# Patient Record
Sex: Female | Born: 1955 | Race: Black or African American | Hispanic: No | Marital: Married | Smoking: Never smoker
Health system: Southern US, Community
[De-identification: ages and names within clinical notes are randomized; demographics above are authoritative.]

## PROBLEM LIST (undated history)

## (undated) DIAGNOSIS — IMO0001 Reserved for inherently not codable concepts without codable children: Secondary | ICD-10-CM

## (undated) DIAGNOSIS — Z9889 Other specified postprocedural states: Secondary | ICD-10-CM

## (undated) DIAGNOSIS — D219 Benign neoplasm of connective and other soft tissue, unspecified: Secondary | ICD-10-CM

## (undated) DIAGNOSIS — Z923 Personal history of irradiation: Secondary | ICD-10-CM

## (undated) DIAGNOSIS — IMO0002 Reserved for concepts with insufficient information to code with codable children: Secondary | ICD-10-CM

## (undated) DIAGNOSIS — R011 Cardiac murmur, unspecified: Secondary | ICD-10-CM

## (undated) DIAGNOSIS — C50919 Malignant neoplasm of unspecified site of unspecified female breast: Secondary | ICD-10-CM

## (undated) DIAGNOSIS — J302 Other seasonal allergic rhinitis: Secondary | ICD-10-CM

## (undated) HISTORY — PX: COLONOSCOPY: SHX174

## (undated) HISTORY — DX: Benign neoplasm of connective and other soft tissue, unspecified: D21.9

## (undated) HISTORY — DX: Malignant neoplasm of unspecified site of unspecified female breast: C50.919

## (undated) HISTORY — DX: Reserved for concepts with insufficient information to code with codable children: IMO0002

## (undated) HISTORY — DX: Reserved for inherently not codable concepts without codable children: IMO0001

---

## 1995-02-08 HISTORY — PX: MYOMECTOMY: SHX85

## 1998-01-14 ENCOUNTER — Other Ambulatory Visit: Admission: RE | Admit: 1998-01-14 | Discharge: 1998-01-14 | Payer: Self-pay | Admitting: Obstetrics and Gynecology

## 1998-02-12 ENCOUNTER — Ambulatory Visit (HOSPITAL_COMMUNITY): Admission: RE | Admit: 1998-02-12 | Discharge: 1998-02-12 | Payer: Self-pay | Admitting: Obstetrics and Gynecology

## 1998-03-13 ENCOUNTER — Encounter: Payer: Self-pay | Admitting: Internal Medicine

## 1998-03-13 ENCOUNTER — Ambulatory Visit (HOSPITAL_COMMUNITY): Admission: RE | Admit: 1998-03-13 | Discharge: 1998-03-13 | Payer: Self-pay | Admitting: Internal Medicine

## 1999-02-09 ENCOUNTER — Other Ambulatory Visit: Admission: RE | Admit: 1999-02-09 | Discharge: 1999-02-09 | Payer: Self-pay | Admitting: Obstetrics and Gynecology

## 1999-02-19 ENCOUNTER — Ambulatory Visit (HOSPITAL_COMMUNITY): Admission: RE | Admit: 1999-02-19 | Discharge: 1999-02-19 | Payer: Self-pay | Admitting: Obstetrics and Gynecology

## 1999-02-19 ENCOUNTER — Encounter: Payer: Self-pay | Admitting: Obstetrics and Gynecology

## 1999-06-04 ENCOUNTER — Encounter: Payer: Self-pay | Admitting: Obstetrics and Gynecology

## 1999-06-04 ENCOUNTER — Ambulatory Visit (HOSPITAL_COMMUNITY): Admission: RE | Admit: 1999-06-04 | Discharge: 1999-06-04 | Payer: Self-pay | Admitting: Obstetrics and Gynecology

## 2000-03-03 ENCOUNTER — Ambulatory Visit (HOSPITAL_COMMUNITY): Admission: RE | Admit: 2000-03-03 | Discharge: 2000-03-03 | Payer: Self-pay | Admitting: Obstetrics and Gynecology

## 2000-03-03 ENCOUNTER — Encounter: Payer: Self-pay | Admitting: Obstetrics and Gynecology

## 2000-05-23 ENCOUNTER — Other Ambulatory Visit: Admission: RE | Admit: 2000-05-23 | Discharge: 2000-05-23 | Payer: Self-pay | Admitting: Obstetrics and Gynecology

## 2001-03-07 ENCOUNTER — Encounter: Payer: Self-pay | Admitting: Obstetrics and Gynecology

## 2001-03-07 ENCOUNTER — Ambulatory Visit (HOSPITAL_COMMUNITY): Admission: RE | Admit: 2001-03-07 | Discharge: 2001-03-07 | Payer: Self-pay | Admitting: Obstetrics and Gynecology

## 2001-05-29 ENCOUNTER — Other Ambulatory Visit: Admission: RE | Admit: 2001-05-29 | Discharge: 2001-05-29 | Payer: Self-pay | Admitting: Obstetrics and Gynecology

## 2002-06-04 ENCOUNTER — Encounter: Payer: Self-pay | Admitting: Obstetrics and Gynecology

## 2002-06-04 ENCOUNTER — Ambulatory Visit (HOSPITAL_COMMUNITY): Admission: RE | Admit: 2002-06-04 | Discharge: 2002-06-04 | Payer: Self-pay | Admitting: Family Medicine

## 2002-06-26 ENCOUNTER — Other Ambulatory Visit: Admission: RE | Admit: 2002-06-26 | Discharge: 2002-06-26 | Payer: Self-pay | Admitting: Obstetrics and Gynecology

## 2003-06-24 ENCOUNTER — Ambulatory Visit (HOSPITAL_COMMUNITY): Admission: RE | Admit: 2003-06-24 | Discharge: 2003-06-24 | Payer: Self-pay | Admitting: Obstetrics and Gynecology

## 2003-06-27 ENCOUNTER — Other Ambulatory Visit: Admission: RE | Admit: 2003-06-27 | Discharge: 2003-06-27 | Payer: Self-pay | Admitting: Obstetrics and Gynecology

## 2004-06-30 ENCOUNTER — Ambulatory Visit (HOSPITAL_COMMUNITY): Admission: RE | Admit: 2004-06-30 | Discharge: 2004-06-30 | Payer: Self-pay | Admitting: Obstetrics and Gynecology

## 2004-07-08 ENCOUNTER — Encounter: Admission: RE | Admit: 2004-07-08 | Discharge: 2004-07-08 | Payer: Self-pay | Admitting: Obstetrics and Gynecology

## 2004-07-14 ENCOUNTER — Other Ambulatory Visit: Admission: RE | Admit: 2004-07-14 | Discharge: 2004-07-14 | Payer: Self-pay | Admitting: Addiction Medicine

## 2005-01-12 ENCOUNTER — Encounter: Admission: RE | Admit: 2005-01-12 | Discharge: 2005-01-12 | Payer: Self-pay | Admitting: Obstetrics and Gynecology

## 2005-07-06 ENCOUNTER — Encounter: Admission: RE | Admit: 2005-07-06 | Discharge: 2005-07-06 | Payer: Self-pay | Admitting: Obstetrics and Gynecology

## 2005-08-15 ENCOUNTER — Other Ambulatory Visit: Admission: RE | Admit: 2005-08-15 | Discharge: 2005-08-15 | Payer: Self-pay | Admitting: Obstetrics and Gynecology

## 2006-08-25 ENCOUNTER — Encounter: Admission: RE | Admit: 2006-08-25 | Discharge: 2006-08-25 | Payer: Self-pay | Admitting: Obstetrics and Gynecology

## 2006-09-14 ENCOUNTER — Other Ambulatory Visit: Admission: RE | Admit: 2006-09-14 | Discharge: 2006-09-14 | Payer: Self-pay | Admitting: Obstetrics and Gynecology

## 2007-03-17 ENCOUNTER — Ambulatory Visit (HOSPITAL_COMMUNITY): Admission: RE | Admit: 2007-03-17 | Discharge: 2007-03-17 | Payer: Self-pay | Admitting: Ophthalmology

## 2007-09-14 ENCOUNTER — Encounter: Admission: RE | Admit: 2007-09-14 | Discharge: 2007-09-14 | Payer: Self-pay | Admitting: Obstetrics and Gynecology

## 2007-09-21 ENCOUNTER — Other Ambulatory Visit: Admission: RE | Admit: 2007-09-21 | Discharge: 2007-09-21 | Payer: Self-pay | Admitting: Obstetrics and Gynecology

## 2008-09-16 ENCOUNTER — Encounter: Admission: RE | Admit: 2008-09-16 | Discharge: 2008-09-16 | Payer: Self-pay | Admitting: Obstetrics and Gynecology

## 2008-09-19 ENCOUNTER — Encounter: Admission: RE | Admit: 2008-09-19 | Discharge: 2008-09-19 | Payer: Self-pay | Admitting: Obstetrics and Gynecology

## 2008-10-07 ENCOUNTER — Ambulatory Visit: Payer: Self-pay | Admitting: Obstetrics and Gynecology

## 2008-10-07 ENCOUNTER — Encounter: Payer: Self-pay | Admitting: Obstetrics and Gynecology

## 2008-10-07 ENCOUNTER — Other Ambulatory Visit: Admission: RE | Admit: 2008-10-07 | Discharge: 2008-10-07 | Payer: Self-pay | Admitting: Obstetrics and Gynecology

## 2009-10-05 ENCOUNTER — Encounter: Admission: RE | Admit: 2009-10-05 | Discharge: 2009-10-05 | Payer: Self-pay | Admitting: Obstetrics and Gynecology

## 2009-10-26 ENCOUNTER — Other Ambulatory Visit: Admission: RE | Admit: 2009-10-26 | Discharge: 2009-10-26 | Payer: Self-pay | Admitting: Obstetrics and Gynecology

## 2009-10-26 ENCOUNTER — Ambulatory Visit: Payer: Self-pay | Admitting: Obstetrics and Gynecology

## 2010-02-02 ENCOUNTER — Ambulatory Visit: Payer: Self-pay | Admitting: Obstetrics and Gynecology

## 2010-02-28 ENCOUNTER — Encounter: Payer: Self-pay | Admitting: Obstetrics and Gynecology

## 2010-09-28 ENCOUNTER — Other Ambulatory Visit: Payer: Self-pay | Admitting: Obstetrics and Gynecology

## 2010-09-28 DIAGNOSIS — Z1231 Encounter for screening mammogram for malignant neoplasm of breast: Secondary | ICD-10-CM

## 2010-10-07 ENCOUNTER — Ambulatory Visit
Admission: RE | Admit: 2010-10-07 | Discharge: 2010-10-07 | Disposition: A | Discharge status: Home / Self Care | Payer: Managed Care, Other (non HMO) | Source: Ambulatory Visit | Attending: Obstetrics and Gynecology | Admitting: Obstetrics and Gynecology

## 2010-10-07 DIAGNOSIS — Z1231 Encounter for screening mammogram for malignant neoplasm of breast: Secondary | ICD-10-CM

## 2010-11-15 ENCOUNTER — Encounter: Payer: Self-pay | Admitting: Gynecology

## 2010-11-15 DIAGNOSIS — D219 Benign neoplasm of connective and other soft tissue, unspecified: Secondary | ICD-10-CM | POA: Insufficient documentation

## 2010-11-16 ENCOUNTER — Ambulatory Visit (INDEPENDENT_AMBULATORY_CARE_PROVIDER_SITE_OTHER): Payer: Managed Care, Other (non HMO) | Admitting: Obstetrics and Gynecology

## 2010-11-16 ENCOUNTER — Encounter: Payer: Self-pay | Admitting: Obstetrics and Gynecology

## 2010-11-16 ENCOUNTER — Other Ambulatory Visit (HOSPITAL_COMMUNITY)
Admission: RE | Admit: 2010-11-16 | Discharge: 2010-11-16 | Disposition: A | Discharge status: Home / Self Care | Payer: Managed Care, Other (non HMO) | Source: Ambulatory Visit | Attending: Obstetrics and Gynecology | Admitting: Obstetrics and Gynecology

## 2010-11-16 VITALS — BP 150/90 | Ht 67.0 in | Wt 182.0 lb

## 2010-11-16 DIAGNOSIS — Z13220 Encounter for screening for lipoid disorders: Secondary | ICD-10-CM

## 2010-11-16 DIAGNOSIS — Z01419 Encounter for gynecological examination (general) (routine) without abnormal findings: Secondary | ICD-10-CM | POA: Insufficient documentation

## 2010-11-16 NOTE — Progress Notes (Signed)
Patient came to see me today for her annual GYN exam. She is up-to-date on mammograms and bone densities. She is doing well without HRT . Her blood pressure was slightly elevated today at 150/90. She is now working at home  And has gained 13 pounds as she snacks a lot.  Physical examination: HEENT within normal limits. Neck: Thyroid not large. No masses. Supraclavicular nodes: not enlarged. Breasts: Examined in both sitting midline position. No skin changes and no masses. Abdomen: Soft no guarding rebound or masses or hernia. Pelvic: External: Within normal limits. BUS: Within normal limits. Vaginal:within normal limits. Good estrogen effect. No evidence of cystocele rectocele or enterocele. Cervix: clean. Uterus: Normal size and shape. Adnexa: No masses. Rectovaginal exam: Confirmatory and negative. Extremities: Within normal limits.  Assessment: Possible hypertension  Plan: Patient to monitor her blood pressure at home and report. Patient work on weight loss. We'll discuss after we review the other blood pressures whether she needs PCP referral.

## 2010-11-16 NOTE — Patient Instructions (Signed)
Take blood pressure 3 or 4 times over the next week and call us with the results.

## 2010-11-23 ENCOUNTER — Telehealth: Payer: Self-pay | Admitting: *Deleted

## 2010-11-23 NOTE — Telephone Encounter (Signed)
These blood pressures are fine. No treatment needed. Continue with diet.

## 2010-11-23 NOTE — Telephone Encounter (Signed)
Patient informed. 

## 2010-11-23 NOTE — Telephone Encounter (Signed)
Patient was calling to report blood pressure readings: 11/19/10 : 135/74 (am)  135/72 (midday)  127/71 (pm) 11/20/10:  131/78 (am)  134/79 (midday)  120/76 (pm) 11/21/10:  128/81 (am)                              119/75 (pm) 11/22/10:  125/75 (am)                              134/82 (pm) 11/23/10:  129/78 (am)  124/77 (2pm) Patient said she has increased her water and fruit.  Stopped snacks and watching salt as well.  The first day she was at work, and the second day she was at home but active cleaning.

## 2010-12-13 ENCOUNTER — Encounter: Payer: Managed Care, Other (non HMO) | Admitting: Obstetrics and Gynecology

## 2011-10-24 ENCOUNTER — Other Ambulatory Visit: Payer: Self-pay | Admitting: Obstetrics and Gynecology

## 2011-11-01 ENCOUNTER — Ambulatory Visit (INDEPENDENT_AMBULATORY_CARE_PROVIDER_SITE_OTHER): Payer: Managed Care, Other (non HMO) | Admitting: Obstetrics and Gynecology

## 2011-11-01 ENCOUNTER — Telehealth: Payer: Self-pay | Admitting: *Deleted

## 2011-11-01 DIAGNOSIS — N6019 Diffuse cystic mastopathy of unspecified breast: Secondary | ICD-10-CM

## 2011-11-01 DIAGNOSIS — N644 Mastodynia: Secondary | ICD-10-CM

## 2011-11-01 NOTE — Progress Notes (Signed)
Patient came to see me today with a 3-4 week history of pain in her left upper breast. She seems to notice it more after working out at gym. She has reduced caffeine and the pain is getting better. She is due for her yearly mammogram. She does not feel any lumps. She has a history of fibrocystic breast disease. She does not take estrogen.  Exam: Kennon Portela present. Both breasts were carefully examined in the sitting and lying positions. There are no skin changes. There are no dominant lesions. She is lumpier in her left upper outer breast.  Assessment: #1. Mastodynia #2. Fibrocystic breast disease  Plan: Continue low caffeine diet. Vitamin E 400 mg daily. Diagnostic mammogram of left breast with screening mammogram of right breast.

## 2011-11-01 NOTE — Telephone Encounter (Signed)
Message copied by Aura Camps on Tue Nov 01, 2011  3:54 PM ------      Message from: Trellis Paganini      Created: Tue Nov 01, 2011 12:04 PM       Patient is having pain in her left upper breast. Schedule her for a diagnostic mammogram of left breast and screening mammogram of  right breast at [facility] on [facility].

## 2011-11-01 NOTE — Patient Instructions (Signed)
We have scheduled you for diagnostic mammogram and will let you know when.

## 2011-11-01 NOTE — Telephone Encounter (Signed)
Order placed for bil. Diag. Mammo.

## 2011-11-02 NOTE — Telephone Encounter (Signed)
appointment 11/07/11 @ 7:20 am

## 2011-11-07 ENCOUNTER — Ambulatory Visit
Admission: RE | Admit: 2011-11-07 | Discharge: 2011-11-07 | Disposition: A | Discharge status: Home / Self Care | Payer: Managed Care, Other (non HMO) | Source: Ambulatory Visit | Attending: Obstetrics and Gynecology | Admitting: Obstetrics and Gynecology

## 2011-11-07 ENCOUNTER — Other Ambulatory Visit: Payer: Self-pay | Admitting: Obstetrics and Gynecology

## 2011-11-07 DIAGNOSIS — N644 Mastodynia: Secondary | ICD-10-CM

## 2011-11-07 DIAGNOSIS — C50919 Malignant neoplasm of unspecified site of unspecified female breast: Secondary | ICD-10-CM | POA: Insufficient documentation

## 2011-11-07 HISTORY — DX: Malignant neoplasm of unspecified site of unspecified female breast: C50.919

## 2011-11-08 ENCOUNTER — Telehealth: Payer: Self-pay | Admitting: Obstetrics and Gynecology

## 2011-11-08 ENCOUNTER — Other Ambulatory Visit: Payer: Self-pay | Admitting: Obstetrics and Gynecology

## 2011-11-08 DIAGNOSIS — C50912 Malignant neoplasm of unspecified site of left female breast: Secondary | ICD-10-CM

## 2011-11-08 NOTE — Telephone Encounter (Signed)
Olegario Messier called me to request I get patient's MRI of Breast for Diagnosis of Breast Cancer precertified.  I called [employer] who require I call [employer] at [phone].  I spoke with Psyche and she gave me approval #[id number] good for 10/1-12/30/13.  I called and provided this approval # to Olegario Messier at the [facility].  (MRI is scheduled Sunday Oct 6 12:30pm at [facility].)

## 2011-11-10 ENCOUNTER — Telehealth: Payer: Self-pay | Admitting: *Deleted

## 2011-11-10 DIAGNOSIS — C50412 Malignant neoplasm of upper-outer quadrant of left female breast: Secondary | ICD-10-CM | POA: Insufficient documentation

## 2011-11-10 DIAGNOSIS — C50419 Malignant neoplasm of upper-outer quadrant of unspecified female breast: Secondary | ICD-10-CM

## 2011-11-10 NOTE — Telephone Encounter (Signed)
Confirmed [facility] for 11/16/11 at 1230 .  Instructions and contact information given.

## 2011-11-13 ENCOUNTER — Ambulatory Visit
Admission: RE | Admit: 2011-11-13 | Discharge: 2011-11-13 | Disposition: A | Discharge status: Home / Self Care | Payer: Managed Care, Other (non HMO) | Source: Ambulatory Visit | Attending: Obstetrics and Gynecology | Admitting: Obstetrics and Gynecology

## 2011-11-13 DIAGNOSIS — C50912 Malignant neoplasm of unspecified site of left female breast: Secondary | ICD-10-CM

## 2011-11-13 MED ORDER — GADOBENATE DIMEGLUMINE 529 MG/ML IV SOLN
17.0000 mL | Freq: Once | INTRAVENOUS | Status: AC | PRN
  Administered 2011-11-13: 17 mL via INTRAVENOUS

## 2011-11-16 ENCOUNTER — Ambulatory Visit (HOSPITAL_BASED_OUTPATIENT_CLINIC_OR_DEPARTMENT_OTHER): Payer: Managed Care, Other (non HMO) | Admitting: Surgery

## 2011-11-16 ENCOUNTER — Telehealth: Payer: Self-pay | Admitting: Oncology

## 2011-11-16 ENCOUNTER — Ambulatory Visit: Payer: Managed Care, Other (non HMO) | Attending: Surgery | Admitting: Physical Therapy

## 2011-11-16 ENCOUNTER — Encounter: Payer: Self-pay | Admitting: Oncology

## 2011-11-16 ENCOUNTER — Encounter: Payer: Self-pay | Admitting: *Deleted

## 2011-11-16 ENCOUNTER — Ambulatory Visit
Admission: RE | Admit: 2011-11-16 | Discharge: 2011-11-16 | Disposition: A | Discharge status: Home / Self Care | Payer: Managed Care, Other (non HMO) | Source: Ambulatory Visit | Attending: Radiation Oncology | Admitting: Radiation Oncology

## 2011-11-16 ENCOUNTER — Ambulatory Visit (HOSPITAL_BASED_OUTPATIENT_CLINIC_OR_DEPARTMENT_OTHER): Payer: Managed Care, Other (non HMO)

## 2011-11-16 ENCOUNTER — Encounter (INDEPENDENT_AMBULATORY_CARE_PROVIDER_SITE_OTHER): Payer: Self-pay | Admitting: Surgery

## 2011-11-16 ENCOUNTER — Other Ambulatory Visit (HOSPITAL_BASED_OUTPATIENT_CLINIC_OR_DEPARTMENT_OTHER): Payer: Managed Care, Other (non HMO) | Admitting: Lab

## 2011-11-16 ENCOUNTER — Ambulatory Visit (HOSPITAL_BASED_OUTPATIENT_CLINIC_OR_DEPARTMENT_OTHER): Payer: Managed Care, Other (non HMO) | Admitting: Oncology

## 2011-11-16 VITALS — BP 180/101 | HR 90 | Temp 98.5°F | Resp 20 | Ht 67.0 in | Wt 184.0 lb

## 2011-11-16 DIAGNOSIS — Z171 Estrogen receptor negative status [ER-]: Secondary | ICD-10-CM

## 2011-11-16 DIAGNOSIS — M25619 Stiffness of unspecified shoulder, not elsewhere classified: Secondary | ICD-10-CM | POA: Insufficient documentation

## 2011-11-16 DIAGNOSIS — C50919 Malignant neoplasm of unspecified site of unspecified female breast: Secondary | ICD-10-CM

## 2011-11-16 DIAGNOSIS — IMO0001 Reserved for inherently not codable concepts without codable children: Secondary | ICD-10-CM | POA: Insufficient documentation

## 2011-11-16 DIAGNOSIS — C50419 Malignant neoplasm of upper-outer quadrant of unspecified female breast: Secondary | ICD-10-CM

## 2011-11-16 DIAGNOSIS — C50912 Malignant neoplasm of unspecified site of left female breast: Secondary | ICD-10-CM

## 2011-11-16 LAB — COMPREHENSIVE METABOLIC PANEL (CC13)
ALT: 18 U/L (ref 0–55)
AST: 19 U/L (ref 5–34)
Albumin: 4.4 g/dL (ref 3.5–5.0)
Calcium: 9.9 mg/dL (ref 8.4–10.4)
Chloride: 105 mEq/L (ref 98–107)
Potassium: 4 mEq/L (ref 3.5–5.1)
Sodium: 141 mEq/L (ref 136–145)

## 2011-11-16 LAB — CBC WITH DIFFERENTIAL/PLATELET
BASO%: 0.4 % (ref 0.0–2.0)
Basophils Absolute: 0 10*3/uL (ref 0.0–0.1)
EOS%: 0.8 % (ref 0.0–7.0)
HGB: 13.6 g/dL (ref 11.6–15.9)
MCH: 27.2 pg (ref 25.1–34.0)
MCHC: 33.4 g/dL (ref 31.5–36.0)
RDW: 14.6 % — ABNORMAL HIGH (ref 11.2–14.5)
lymph#: 2.2 10*3/uL (ref 0.9–3.3)

## 2011-11-16 NOTE — Progress Notes (Signed)
Radiation Oncology         [phone]) [phone] ________________________________  Name: Katherine Coleman MRN: [id number]  Date: 11/16/2011  DOB: 01-31-1956  CC:No primary provider on file.  Shelly Rubenstein, MD     Drue Second, MD  REFERRING PHYSICIAN: Shelly Rubenstein, MD   DIAGNOSIS: There were no encounter diagnoses.   HISTORY OF PRESENT ILLNESS::Katherine Coleman is a 56 y.o. female who is seen for an initial consultation visit. The patient is seen in multidisciplinary breast clinic today. Her case was discussed this morning at breast conference as well.  The patient indicates that she experience some pain in the left breast region and she saw evaluation for this. This led to a diagnostic bilateral mammogram on 11/07/2011. No mass was palpated in the left upper outer quadrant at that time. There is a 1.8 cm irregular mass present in the left upper outer quadrant posteriorly. No abnormalities were noted on the right. The patient therefore proceeded to undergo an ultrasound which revealed a 1.4 cm hypoechoic, irregular mass at the 1:00 position. There was also a second oval mass at the 1:30 o'clock position measuring 1.2 cm. There is also an abnormal left axillary lymph node with a markedly thickened cortex. Biopsy was performed of 3 separate areas. 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 The mass at the 1:30 o'clock position revealed no evidence of malignancy. There was also no evidence of carcinoma in one of one lymph nodes. For the positive tumor, receptor studies indicated that the tumor is ER positive and PR positive as well as HER-2/neu negative. The Ki-67 staining was 62%.  The patient proceeded to undergo an MRI scan of the breasts bilaterally. There is a 4.3 cm irregular, non-masslike area of enhancement in the upper outer quadrant of the left breast. Some of this was felt to be secondary to postoperative  hematoma. The actual discrete tumor was felt to be closer to 2-2-1/2 cm.  Given these findings and recent diagnosis I been asked to see the patient today for consideration of possible radiotherapy.   PREVIOUS RADIATION THERAPY: No   PAST MEDICAL HISTORY:  has a past medical history of Fibroid and Breast cancer.     PAST SURGICAL HISTORY: Past Surgical History  Procedure Date  . Myomectomy      FAMILY HISTORY: family history includes Cancer in her paternal aunt; Hypertension in her father and mother; and Prostate cancer in her father.   SOCIAL HISTORY:  reports that she has never smoked. She does not have any smokeless tobacco history on file. She reports that she drinks alcohol. She reports that she does not use illicit drugs.   ALLERGIES: Penicillins   MEDICATIONS:  Current Outpatient Prescriptions  Medication Sig Dispense Refill  . Calcium Carbonate-Vitamin D (CALCIUM + D PO) Take by mouth.        . Cholecalciferol (VITAMIN D PO) Take 1,000 Units by mouth.        . Loratadine (CLARITIN PO) Take by mouth.        . Multiple Vitamin (MULTIVITAMIN) tablet Take 1 tablet by mouth daily.        Marland Kitchen VITAMIN E PO Take by mouth.           REVIEW OF SYSTEMS:  A 15 point review of systems is documented in the electronic medical record. This was obtained by the nursing staff. However, I reviewed this with the patient to discuss relevant findings and make  appropriate changes.  A comprehensive review of systems was negative.    PHYSICAL EXAM:  vitals were not taken for this visit.  General: Well-developed, in no acute distress HEENT: Normocephalic, atraumatic; extraocular movements intact Neck: Supple without any lymphadenopathy Cardiovascular: Regular rate and rhythm Respiratory: Clear to auscultation bilaterally Breasts: Street mass was palpated within the left breast, no axillary adenopathy on this side. No skin changes or nipple changes. Benign findings within the right breast and  no axillary adenopathy on this side GI: Soft, nontender, normal bowel sounds Extremities: No edema present Neuro: No focal deficits     LABORATORY DATA:  Lab Results  Component Value Date   WBC 5.3 11/16/2011   HGB 13.6 11/16/2011   HCT 40.7 11/16/2011   MCV 81.4 11/16/2011   PLT 261 11/16/2011   Lab Results  Component Value Date   NA 141 11/16/2011   K 4.0 11/16/2011   CL 105 11/16/2011   CO2 24 11/16/2011   Lab Results  Component Value Date   ALT 18 11/16/2011   AST 19 11/16/2011   ALKPHOS 91 11/16/2011   BILITOT 0.60 11/16/2011      RADIOGRAPHY: US Breast Left  11/07/2011  *RADIOLOGY REPORT*  Clinical Data:  The patient experienced pain in the left upper outer quadrant that resolved following the cessation of caffeine intake and avoidance of workout machines.  DIGITAL DIAGNOSTIC BILATERAL MAMMOGRAM WITH CAD AND LEFT BREAST ULTRASOUND:  Comparison:  10/07/2010, 10/05/2009, 09/16/2008  Findings:  There are scattered fibroglandular densities.  There is a 1.8 cm irregular mass in the left upper outer quadrant posteriorly.  No abnormality is noted on the right. Mammographic images were processed with CAD.  On physical exam, no mass is palpated in the left upper outer quadrant.  Ultrasound is performed, showing an irregular hypoechoic mass at 1 o'clock 10 cm from the right nipple measuring at least 0.9 x 0.9 x 1.4 cm.  There is a second oval mass at 1:30 o'clock, 7 cm from the right nipple measuring approximately 1.2 x 1.2 x 0.4 cm.  There is an abnormal left axillary lymph node with markedly thickened cortex and compression of the normal echogenic hilum.  The appearance is suspicious for invasive mammary carcinoma with possible axillary metastasis.  Biopsy is recommended.  Options of surgical excisional biopsy and ultrasound-guided core needle biopsy were discussed with the patient.  I suggested ultrasound-guided core needle biopsy and the patient agreed with this plan.  IMPRESSION: Two suspicious  masses in the left upper outer quadrant with suspicious left axillary lymph node.  RECOMMENDATION: Ultrasound-guided core needle biopsy is recommended.  This will be performed and reported separately.  BI-RADS CATEGORY 5:  Highly suggestive of malignancy - appropriate action should be taken.   Original Report Authenticated By: Daryl Eastern, M.D.    Mr Breast Bilateral W Wo Contrast  11/14/2011  *RADIOLOGY REPORT*  Clinical Data: The patient underwent an ultrasound-guided core biopsy of a mass in the 1 o'clock region of the left breast which demonstrated invasive ductal carcinoma with lymphovascular invasion (ribbon shaped clip).  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 Histologic evaluation of the left axillary lymph node demonstrated no evidence of carcinoma.  BILATERAL BREAST MRI WITH AND WITHOUT CONTRAST  Technique: Multiplanar, multisequence MR images of both breasts were obtained prior to and following the intravenous administration of 17ml of multihance.  Three dimensional images  were evaluated at the independent DynaCad workstation.  Comparison:  Mammograms dated 11/07/2011 and 10/07/2010.  Findings: There is moderate background parenchymal enhancement pattern.  In the upper outer quadrant of the left breast there is irregular, non mass-like enhancement measuring 4.3 x 2.9 x 1.8 cm.  Some of the enhancement is thought to be secondary to postoperative hematoma.   Inferior to the abnormal enhancement is a second signal void artifact from the clip in the hyalinized fibroadenoma.  There is a 1.4 cm mildly prominent left axillary lymph node.  No abnormal enhancement is seen in the right breast.  IMPRESSION: 4.3 cm of irregular, non mass-like enhancement in the upper outer quadrant of the left breast where the invasive ductal carcinoma with lymphovascular invasion was biopsied.  Some of  the enhancement is thought to be secondary to post biopsy hematoma.  Mildly prominent left axillary lymph node.  RECOMMENDATION: Treatment planning is recommended.  THREE-DIMENSIONAL MR IMAGE RENDERING ON INDEPENDENT WORKSTATION:  Three-dimensional MR images were rendered by post-processing of the original MR data on an independent workstation.  The three- dimensional MR images were interpreted, and findings were reported in the accompanying complete MRI report for this study.  BI-RADS CATEGORY 6:  Known biopsy-proven malignancy - appropriate action should be taken.   Original Report Authenticated By: Littie Deeds. Judyann Munson, M.D.    [place] Core Biopsy  11/09/2011  *RADIOLOGY REPORT*  Clinical Data:  Two masses in the left upper outer quadrant and an abnormal left axillary lymph node  ULTRASOUND GUIDED VACUUM ASSISTED CORE BIOPSY OF THE LEFT BREAST X TWO AND ULTRASOUND-GUIDED CORE NEEDLE BIOPSY OF THE LEFT AXILLA:  The patient and I discussed the procedure of ultrasound-guided biopsy, including benefits and alternatives.  We discussed the high likelihood of a successful procedure. We discussed the risks of the procedure including infection, bleeding, tissue injury, clip migration, and inadequate sampling.  Informed written consent was given.  Using sterile technique, 2% lidocaine, ultrasound guidance, and a 12 gauge vacuum assisted needle, biopsy was performed of the mass at 1 o'clock 10 cm from the left nipple using a lateromedial approach.  Using sterile technique, 2% lidocaine, ultrasound guidance, and a 12 gauge vacuum-assisted needle, biopsy was performed of the mass at 1:30 o'clock, 7 cm from the left nipple using a lateromedial approach.  Using sterile technique, 2% lidocaine, ultrasound guidance, and 814 gauge spring-loaded device, biopsy was performed of the left axillary lymph node.  At the conclusion of the procedure, a ribbon tissue marker clip was deployed into the biopsy cavity of the mass at 1 o'clock.  A coil  tissue marker clip was deployed into the biopsy cavity of the mass at 1:30 o'clock.  Follow-up 2-view mammogram was performed and dictated separately.  Histologic evaluation of the mass at 1 o'clock demonstrates invasive ductal carcinoma with lymphovascular invasion.  The carcinoma appears to be grade II.  Histologic evaluation of the mass at 1:30 o'clock demonstrates benign hyalinized fibroadenoma.  Histologic evaluation of the left axillary lymph node demonstrates no evidence of carcinoma.  These findings are concordant with the imaging findings.  Results were discussed with the patient by telephone at her request.  Her husband listened to the telephone conversation.  She reports no complications from the procedure.  Breast MRI is scheduled for 11/13/2011.  The patient is scheduled to be evaluated in the [facility] on 11/16/2011.  Questions were answered.  The patient was encouraged to pick up educational materials at our office.  IMPRESSION:  Ultrasound-guided biopsy of two masses in the left upper outer quadrant and an abnormal left axillary lymph node. The mass at 1 o'clock is concordant with invasive ductal carcinoma with lymphovascular invasion.  The mass at 1:30 o'clock he is a benign hyalinized fibroadenoma.  The left axillary lymph node demonstrates no evidence of carcinoma.  Breast MRI and [facility] scheduled.  No apparent complications.   Original Report Authenticated By: Daryl Eastern, M.D.    [place] Core Biopsy  11/09/2011  *RADIOLOGY REPORT*  Clinical Data:  Two masses in the left upper outer quadrant and an abnormal left axillary lymph node  ULTRASOUND GUIDED VACUUM ASSISTED CORE BIOPSY OF THE LEFT BREAST X TWO AND ULTRASOUND-GUIDED CORE NEEDLE BIOPSY OF THE LEFT AXILLA:  The patient and I discussed the procedure of ultrasound-guided biopsy, including benefits and alternatives.  We discussed the high likelihood of a successful  procedure. We discussed the risks of the procedure including infection, bleeding, tissue injury, clip migration, and inadequate sampling.  Informed written consent was given.  Using sterile technique, 2% lidocaine, ultrasound guidance, and a 12 gauge vacuum assisted needle, biopsy was performed of the mass at 1 o'clock 10 cm from the left nipple using a lateromedial approach.  Using sterile technique, 2% lidocaine, ultrasound guidance, and a 12 gauge vacuum-assisted needle, biopsy was performed of the mass at 1:30 o'clock, 7 cm from the left nipple using a lateromedial approach.  Using sterile technique, 2% lidocaine, ultrasound guidance, and 814 gauge spring-loaded device, biopsy was performed of the left axillary lymph node.  At the conclusion of the procedure, a ribbon tissue marker clip was deployed into the biopsy cavity of the mass at 1 o'clock.  A coil tissue marker clip was deployed into the biopsy cavity of the mass at 1:30 o'clock.  Follow-up 2-view mammogram was performed and dictated separately.  Histologic evaluation of the mass at 1 o'clock demonstrates invasive ductal carcinoma with lymphovascular invasion.  The carcinoma appears to be grade II.  Histologic evaluation of the mass at 1:30 o'clock demonstrates benign hyalinized fibroadenoma.  Histologic evaluation of the left axillary lymph node demonstrates no evidence of carcinoma.  These findings are concordant with the imaging findings.  Results were discussed with the patient by telephone at her request.  Her husband listened to the telephone conversation.  She reports no complications from the procedure.  Breast MRI is scheduled for 11/13/2011.  The patient is scheduled to be evaluated in the [facility] on 11/16/2011.  Questions were answered.  The patient was encouraged to pick up educational materials at our office.  IMPRESSION:  Ultrasound-guided biopsy of two masses in the left upper outer quadrant and an  abnormal left axillary lymph node. The mass at 1 o'clock is concordant with invasive ductal carcinoma with lymphovascular invasion.  The mass at 1:30 o'clock he is a benign hyalinized fibroadenoma.  The left axillary lymph node demonstrates no evidence of carcinoma.  Breast MRI and [facility] scheduled.  No apparent complications.   Original Report Authenticated By: Daryl Eastern, M.D.    [place] Core Biopsy  11/09/2011  *RADIOLOGY REPORT*  Clinical Data:  Two masses in the left upper outer quadrant and an abnormal left axillary lymph node  ULTRASOUND GUIDED VACUUM ASSISTED CORE BIOPSY OF THE LEFT BREAST X TWO AND ULTRASOUND-GUIDED CORE NEEDLE BIOPSY OF THE LEFT AXILLA:  The patient and I discussed the procedure of ultrasound-guided biopsy, including benefits and alternatives.  We discussed the high likelihood of a successful procedure. We discussed the risks of the procedure including infection, bleeding, tissue injury, clip migration, and inadequate sampling.  Informed written consent was given.  Using sterile technique, 2% lidocaine, ultrasound guidance, and a 12 gauge vacuum assisted needle, biopsy was performed of the mass at 1 o'clock 10 cm from the left nipple using a lateromedial approach.  Using sterile technique, 2% lidocaine, ultrasound guidance, and a 12 gauge vacuum-assisted needle, biopsy was performed of the mass at 1:30 o'clock, 7 cm from the left nipple using a lateromedial approach.  Using sterile technique, 2% lidocaine, ultrasound guidance, and 814 gauge spring-loaded device, biopsy was performed of the left axillary lymph node.  At the conclusion of the procedure, a ribbon tissue marker clip was deployed into the biopsy cavity of the mass at 1 o'clock.  A coil tissue marker clip was deployed into the biopsy cavity of the mass at 1:30 o'clock.  Follow-up 2-view mammogram was performed and dictated separately.  Histologic evaluation of the mass at 1 o'clock  demonstrates invasive ductal carcinoma with lymphovascular invasion.  The carcinoma appears to be grade II.  Histologic evaluation of the mass at 1:30 o'clock demonstrates benign hyalinized fibroadenoma.  Histologic evaluation of the left axillary lymph node demonstrates no evidence of carcinoma.  These findings are concordant with the imaging findings.  Results were discussed with the patient by telephone at her request.  Her husband listened to the telephone conversation.  She reports no complications from the procedure.  Breast MRI is scheduled for 11/13/2011.  The patient is scheduled to be evaluated in the [facility] on 11/16/2011.  Questions were answered.  The patient was encouraged to pick up educational materials at our office.  IMPRESSION:  Ultrasound-guided biopsy of two masses in the left upper outer quadrant and an abnormal left axillary lymph node. The mass at 1 o'clock is concordant with invasive ductal carcinoma with lymphovascular invasion.  The mass at 1:30 o'clock he is a benign hyalinized fibroadenoma.  The left axillary lymph node demonstrates no evidence of carcinoma.  Breast MRI and [facility] scheduled.  No apparent complications.   Original Report Authenticated By: Daryl Eastern, M.D.    Mm Digital Diagnostic Bilat  11/07/2011  *RADIOLOGY REPORT*  Clinical Data:  The patient experienced pain in the left upper outer quadrant that resolved following the cessation of caffeine intake and avoidance of workout machines.  DIGITAL DIAGNOSTIC BILATERAL MAMMOGRAM WITH CAD AND LEFT BREAST ULTRASOUND:  Comparison:  10/07/2010, 10/05/2009, 09/16/2008  Findings:  There are scattered fibroglandular densities.  There is a 1.8 cm irregular mass in the left upper outer quadrant posteriorly.  No abnormality is noted on the right. Mammographic images were processed with CAD.  On physical exam, no mass is palpated in the left upper  outer quadrant.  Ultrasound is performed, showing an irregular hypoechoic mass at 1 o'clock 10 cm from the right nipple measuring at least 0.9 x 0.9 x 1.4 cm.  There is a second oval mass at 1:30 o'clock, 7 cm from the right nipple measuring approximately 1.2 x 1.2 x 0.4 cm.  There is an abnormal left axillary lymph node with markedly thickened cortex and compression of the normal echogenic hilum.  The appearance is suspicious for invasive mammary carcinoma with possible axillary metastasis.  Biopsy is recommended.  Options of surgical excisional biopsy and ultrasound-guided core needle biopsy were discussed with the patient.  I suggested ultrasound-guided  core needle biopsy and the patient agreed with this plan.  IMPRESSION: Two suspicious masses in the left upper outer quadrant with suspicious left axillary lymph node.  RECOMMENDATION: Ultrasound-guided core needle biopsy is recommended.  This will be performed and reported separately.  BI-RADS CATEGORY 5:  Highly suggestive of malignancy - appropriate action should be taken.   Original Report Authenticated By: Daryl Eastern, M.D.    Mm Digital Diagnostic Unilat L  11/07/2011  *RADIOLOGY REPORT*  Clinical Data:  Ultrasound-guided core needle biopsy of two masses in the left upper outer quadrant with clip placement.  DIGITAL DIAGNOSTIC LEFT MAMMOGRAM  Comparison:  Previous exams.  Findings:  Films are performed following ultrasound guided biopsy of a mass at 1 o'clock 10 cm from the left nipple and at 1:30 o'clock, 7 cm from the left nipple.  The ribbon clip is appropriately positioned within the mass at 1 o'clock.  The coil clip is appropriately positioned within the mass at 1:30 o'clock.  IMPRESSION: Appropriate clip placement following ultrasound-guided core needle biopsy of two masses in the left upper outer quadrant.   Original Report Authenticated By: Daryl Eastern, M.D.        IMPRESSION: 56 year old female with a recent diagnosis of  invasive ductal carcinoma of the left breast. Clinically this appears to represent a T2, N0, M0 tumor. The tumor is ER positive, PR positive, and HER-2/neu negative.  The patient appears to be a good candidate for breast conservation treatment. This was the consensus at conference today. Agent seems to be interested in this approach. Medical oncology has also seen the patient and is recommending an Oncotype test as well as potential anti-hormonal treatment subsequently. For her case, I recommend a course of adjuvant radiotherapy at the appropriate time.  This recommendation was discussed with the patient. I would anticipate a 6-1/2 week course of treatment. We discussed the details of such a plan as well as the rationale in terms of improvement of local control. All of her questions were answered. We did discuss in detail the possible side effects and risks of such a treatment as well.   PLAN: The patient is to proceed with a lumpectomy and sentinel lymph node evaluation as well as an Oncotype test. I look forward to seeing her postoperatively to review her case and how she is recovering from her surgery. If appropriate we will proceed with adjuvant radiotherapy and this will need to be coordinated with medical oncology given her results from the Oncotype test and a final decision with regards to systemic treatment.    I spent 60 minutes minutes face to face with the patient and more than 50% of that time was spent in counseling and/or coordination of care.    ________________________________   Radene Gunning, MD, PhD

## 2011-11-16 NOTE — Progress Notes (Signed)
Katherine Coleman [id number] [birth year] 56 y.o. 11/16/2011 1:51 PM  CC Dr. Carman Ching Dr. Dorothy Puffer Dr. Edyth Gunnels  REASON FOR CONSULTATION:  56 year old female with new diagnosis of invasive breast cancer of the left breast. Patient was seen in the Multidisciplinary Breast Clinic for discussion of her treatment options.  STAGE:   No matching staging information was found for the patient.  REFERRING PHYSICIAN: Dr. Carman Ching  HISTORY OF PRESENT ILLNESS:  Katherine Coleman is a 56 y.o. female.  Who recently was seen for breast tenderness in the left breast. This led to a diagnostic bilateral mammogram on 11/07/2011. This showed a 1.8 cm irregular mass in the left upper outer quadrant posteriorly. Biopsy was performed that showed an invasive ductal carcinoma. On ultrasound she was also noted to have a 1.4 cm hypoechoic irregular mass at the 1:00 position there was also a second oval mass at the 1:30 o'clock position measuring 1.2 cm. There was also an abnormal left axillary lymph node with a markedly thickened cortex. Biopsies were performed of all 3 stereo's. The needle core biopsy of the 1:00 mass showed invasive ductal carcinoma with lymphovascular invasion. Mass at the 1:30 o'clock position showed no malignancy and also no evidence of malignancy in the lymph node. The tumor from the 1:00 position was positive for ER receptor and PR receptor and negative for HER-2/neu receptor Ki-67 was 62%. Patient had MRI of the breasts performed. The MRI showed a 4.3 cm irregular non-masslike area of enhancement in the upper outer quadrant of the left breast. This area was felt to be secondary to postoperative hematoma. The discrete tumor was felt to be about 2-2-1/2 cm. Patient is without any complaints. She is seen in the [facility] clinic today for discussion of her treatment options. Patient case was also discussed at the multidisciplinary breast conference this morning. All of her pathology  was reviewed as well as her radiology. Recommendations for treatment of her based on NCCN guidelines for early stage breast cancer.   Past Medical History: Past Medical History  Diagnosis Date  . Fibroid     Past Surgical History: Past Surgical History  Procedure Date  . Myomectomy     Family History: Family History  Problem Relation Age of Onset  . Hypertension Mother   . Hypertension Father   . Cancer Paternal Aunt     Colon cancer    Social History History  Substance Use Topics  . Smoking status: Never Smoker   . Smokeless tobacco: Not on file  . Alcohol Use: Yes     occas    Allergies: Allergies  Allergen Reactions  . Penicillins     Current Medications: Current Outpatient Prescriptions  Medication Sig Dispense Refill  . Calcium Carbonate-Vitamin D (CALCIUM + D PO) Take by mouth.        . Cholecalciferol (VITAMIN D PO) Take 1,000 Units by mouth.        . Loratadine (CLARITIN PO) Take by mouth.        . Multiple Vitamin (MULTIVITAMIN) tablet Take 1 tablet by mouth daily.        Marland Kitchen VITAMIN E PO Take by mouth.          OB/GYN History: Menarche at age 73 patient underwent menopause at 30 she has not been on hormone replacement therapy first birth was at age 55  Fertility Discussion: Not applicable Prior History of Cancer: Not applicable  Health Maintenance:  Colonoscopy last five-year Bone Density 2 years ago Last PAP  smear 2000  ECOG PERFORMANCE STATUS: 0 - Asymptomatic  Genetic Counseling/testing: Paternal cousin had breast cancer in her 58s father had prostate cancer at 37 at this time genetic counseling is not recommended  REVIEW OF SYSTEMS: In general patient denies any fatigue fevers chills or night sweats. Patient denies any nausea vomiting headaches shortness of breath chest pains palpitations no myalgias and arthralgias no, pain no changes in bowel bladder habits no arthritis denies any peripheral paresthesias no bleeding problems remainder of  the 14 point review of systems is negative.  PHYSICAL EXAMINATION: Blood pressure 180/101, pulse 90, temperature 98.5 F (36.9 C), resp. rate 20, height 5\' 7"  (1.702 m), weight 184 lb (83.462 kg). Patient is well-developed well-nourished female in no acute distress HEENT exam EOMI PERRLA sclerae anicteric no conjunctival pallor oral mucosa is moist neck is supple lungs are clear to auscultation and percussion cardiovascular is regular rate rhythm no murmurs gallops or rubs abdomen is soft nontender nondistended bowel sounds are present no HSM extremities no clubbing edema or cyanosis neuro patient's alert oriented otherwise nonfocal Breast exam right breast no masses or nipple discharge no skin changes no dominant masses. Left breast reveals area of ecchymosis with a palpable hematoma there is tenderness noted no nipple inversion or retraction there  STUDIES/RESULTS: US Breast Left  11/09/11  *RADIOLOGY REPORT*  Clinical Data:  The patient experienced pain in the left upper outer quadrant that resolved following the cessation of caffeine intake and avoidance of workout machines.  DIGITAL DIAGNOSTIC BILATERAL MAMMOGRAM WITH CAD AND LEFT BREAST ULTRASOUND:  Comparison:  10/07/2010, 10/05/2009, 09/16/2008  Findings:  There are scattered fibroglandular densities.  There is a 1.8 cm irregular mass in the left upper outer quadrant posteriorly.  No abnormality is noted on the right. Mammographic images were processed with CAD.  On physical exam, no mass is palpated in the left upper outer quadrant.  Ultrasound is performed, showing an irregular hypoechoic mass at 1 o'clock 10 cm from the right nipple measuring at least 0.9 x 0.9 x 1.4 cm.  There is a second oval mass at 1:30 o'clock, 7 cm from the right nipple measuring approximately 1.2 x 1.2 x 0.4 cm.  There is an abnormal left axillary lymph node with markedly thickened cortex and compression of the normal echogenic hilum.  The appearance is suspicious for  invasive mammary carcinoma with possible axillary metastasis.  Biopsy is recommended.  Options of surgical excisional biopsy and ultrasound-guided core needle biopsy were discussed with the patient.  I suggested ultrasound-guided core needle biopsy and the patient agreed with this plan.  IMPRESSION: Two suspicious masses in the left upper outer quadrant with suspicious left axillary lymph node.  RECOMMENDATION: Ultrasound-guided core needle biopsy is recommended.  This will be performed and reported separately.  BI-RADS CATEGORY 5:  Highly suggestive of malignancy - appropriate action should be taken.   Original Report Authenticated By: Daryl Eastern, M.D.    Mr Breast Bilateral W Wo Contrast  11/14/2011  *RADIOLOGY REPORT*  Clinical Data: The patient underwent an ultrasound-guided core biopsy of a mass in the 1 o'clock region of the left breast which demonstrated invasive ductal carcinoma with lymphovascular invasion (ribbon shaped clip).  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 Histologic evaluation of the left axillary lymph node demonstrated no evidence of carcinoma.  BILATERAL BREAST MRI WITH AND WITHOUT CONTRAST  Technique: Multiplanar, multisequence MR  images of both breasts were obtained prior to and following the intravenous administration of 17ml of multihance.  Three dimensional images were evaluated at the independent DynaCad workstation.  Comparison:  Mammograms dated 11/07/2011 and 10/07/2010.  Findings: There is moderate background parenchymal enhancement pattern.  In the upper outer quadrant of the left breast there is irregular, non mass-like enhancement measuring 4.3 x 2.9 x 1.8 cm.  Some of the enhancement is thought to be secondary to postoperative hematoma.   Inferior to the abnormal enhancement is a second signal void artifact from the clip in the hyalinized  fibroadenoma.  There is a 1.4 cm mildly prominent left axillary lymph node.  No abnormal enhancement is seen in the right breast.  IMPRESSION: 4.3 cm of irregular, non mass-like enhancement in the upper outer quadrant of the left breast where the invasive ductal carcinoma with lymphovascular invasion was biopsied.  Some of the enhancement is thought to be secondary to post biopsy hematoma.  Mildly prominent left axillary lymph node.  RECOMMENDATION: Treatment planning is recommended.  THREE-DIMENSIONAL MR IMAGE RENDERING ON INDEPENDENT WORKSTATION:  Three-dimensional MR images were rendered by post-processing of the original MR data on an independent workstation.  The three- dimensional MR images were interpreted, and findings were reported in the accompanying complete MRI report for this study.  BI-RADS CATEGORY 6:  Known biopsy-proven malignancy - appropriate action should be taken.   Original Report Authenticated By: Littie Deeds. Judyann Munson, M.D.    [place] Core Biopsy  11/09/2011  *RADIOLOGY REPORT*  Clinical Data:  Two masses in the left upper outer quadrant and an abnormal left axillary lymph node  ULTRASOUND GUIDED VACUUM ASSISTED CORE BIOPSY OF THE LEFT BREAST X TWO AND ULTRASOUND-GUIDED CORE NEEDLE BIOPSY OF THE LEFT AXILLA:  The patient and I discussed the procedure of ultrasound-guided biopsy, including benefits and alternatives.  We discussed the high likelihood of a successful procedure. We discussed the risks of the procedure including infection, bleeding, tissue injury, clip migration, and inadequate sampling.  Informed written consent was given.  Using sterile technique, 2% lidocaine, ultrasound guidance, and a 12 gauge vacuum assisted needle, biopsy was performed of the mass at 1 o'clock 10 cm from the left nipple using a lateromedial approach.  Using sterile technique, 2% lidocaine, ultrasound guidance, and a 12 gauge vacuum-assisted needle, biopsy was performed of the mass at 1:30 o'clock, 7 cm from the left  nipple using a lateromedial approach.  Using sterile technique, 2% lidocaine, ultrasound guidance, and 814 gauge spring-loaded device, biopsy was performed of the left axillary lymph node.  At the conclusion of the procedure, a ribbon tissue marker clip was deployed into the biopsy cavity of the mass at 1 o'clock.  A coil tissue marker clip was deployed into the biopsy cavity of the mass at 1:30 o'clock.  Follow-up 2-view mammogram was performed and dictated separately.  Histologic evaluation of the mass at 1 o'clock demonstrates invasive ductal carcinoma with lymphovascular invasion.  The carcinoma appears to be grade II.  Histologic evaluation of the mass at 1:30 o'clock demonstrates benign hyalinized fibroadenoma.  Histologic evaluation of the left axillary lymph node demonstrates no evidence of carcinoma.  These findings are concordant with the imaging findings.  Results were discussed with the patient by telephone at her request.  Her husband listened to the telephone conversation.  She reports no complications from the procedure.  Breast MRI is scheduled for 11/13/2011.  The patient is scheduled to be evaluated in the [facility]  on 11/16/2011.  Questions were answered.  The patient was encouraged to pick up educational materials at our office.  IMPRESSION:  Ultrasound-guided biopsy of two masses in the left upper outer quadrant and an abnormal left axillary lymph node. The mass at 1 o'clock is concordant with invasive ductal carcinoma with lymphovascular invasion.  The mass at 1:30 o'clock he is a benign hyalinized fibroadenoma.  The left axillary lymph node demonstrates no evidence of carcinoma.  Breast MRI and [facility] scheduled.  No apparent complications.   Original Report Authenticated By: Daryl Eastern, M.D.    [place] Core Biopsy  11/09/2011  *RADIOLOGY REPORT*  Clinical Data:  Two masses in the left upper outer quadrant and an  abnormal left axillary lymph node  ULTRASOUND GUIDED VACUUM ASSISTED CORE BIOPSY OF THE LEFT BREAST X TWO AND ULTRASOUND-GUIDED CORE NEEDLE BIOPSY OF THE LEFT AXILLA:  The patient and I discussed the procedure of ultrasound-guided biopsy, including benefits and alternatives.  We discussed the high likelihood of a successful procedure. We discussed the risks of the procedure including infection, bleeding, tissue injury, clip migration, and inadequate sampling.  Informed written consent was given.  Using sterile technique, 2% lidocaine, ultrasound guidance, and a 12 gauge vacuum assisted needle, biopsy was performed of the mass at 1 o'clock 10 cm from the left nipple using a lateromedial approach.  Using sterile technique, 2% lidocaine, ultrasound guidance, and a 12 gauge vacuum-assisted needle, biopsy was performed of the mass at 1:30 o'clock, 7 cm from the left nipple using a lateromedial approach.  Using sterile technique, 2% lidocaine, ultrasound guidance, and 814 gauge spring-loaded device, biopsy was performed of the left axillary lymph node.  At the conclusion of the procedure, a ribbon tissue marker clip was deployed into the biopsy cavity of the mass at 1 o'clock.  A coil tissue marker clip was deployed into the biopsy cavity of the mass at 1:30 o'clock.  Follow-up 2-view mammogram was performed and dictated separately.  Histologic evaluation of the mass at 1 o'clock demonstrates invasive ductal carcinoma with lymphovascular invasion.  The carcinoma appears to be grade II.  Histologic evaluation of the mass at 1:30 o'clock demonstrates benign hyalinized fibroadenoma.  Histologic evaluation of the left axillary lymph node demonstrates no evidence of carcinoma.  These findings are concordant with the imaging findings.  Results were discussed with the patient by telephone at her request.  Her husband listened to the telephone conversation.  She reports no complications from the procedure.  Breast MRI is  scheduled for 11/13/2011.  The patient is scheduled to be evaluated in the [facility] on 11/16/2011.  Questions were answered.  The patient was encouraged to pick up educational materials at our office.  IMPRESSION:  Ultrasound-guided biopsy of two masses in the left upper outer quadrant and an abnormal left axillary lymph node. The mass at 1 o'clock is concordant with invasive ductal carcinoma with lymphovascular invasion.  The mass at 1:30 o'clock he is a benign hyalinized fibroadenoma.  The left axillary lymph node demonstrates no evidence of carcinoma.  Breast MRI and [facility] scheduled.  No apparent complications.   Original Report Authenticated By: Daryl Eastern, M.D.    [place] Core Biopsy  11/09/2011  *RADIOLOGY REPORT*  Clinical Data:  Two masses in the left upper outer quadrant and an abnormal left axillary lymph node  ULTRASOUND GUIDED VACUUM ASSISTED CORE BIOPSY OF THE LEFT BREAST X TWO AND ULTRASOUND-GUIDED CORE  NEEDLE BIOPSY OF THE LEFT AXILLA:  The patient and I discussed the procedure of ultrasound-guided biopsy, including benefits and alternatives.  We discussed the high likelihood of a successful procedure. We discussed the risks of the procedure including infection, bleeding, tissue injury, clip migration, and inadequate sampling.  Informed written consent was given.  Using sterile technique, 2% lidocaine, ultrasound guidance, and a 12 gauge vacuum assisted needle, biopsy was performed of the mass at 1 o'clock 10 cm from the left nipple using a lateromedial approach.  Using sterile technique, 2% lidocaine, ultrasound guidance, and a 12 gauge vacuum-assisted needle, biopsy was performed of the mass at 1:30 o'clock, 7 cm from the left nipple using a lateromedial approach.  Using sterile technique, 2% lidocaine, ultrasound guidance, and 814 gauge spring-loaded device, biopsy was performed of the left axillary lymph node.  At  the conclusion of the procedure, a ribbon tissue marker clip was deployed into the biopsy cavity of the mass at 1 o'clock.  A coil tissue marker clip was deployed into the biopsy cavity of the mass at 1:30 o'clock.  Follow-up 2-view mammogram was performed and dictated separately.  Histologic evaluation of the mass at 1 o'clock demonstrates invasive ductal carcinoma with lymphovascular invasion.  The carcinoma appears to be grade II.  Histologic evaluation of the mass at 1:30 o'clock demonstrates benign hyalinized fibroadenoma.  Histologic evaluation of the left axillary lymph node demonstrates no evidence of carcinoma.  These findings are concordant with the imaging findings.  Results were discussed with the patient by telephone at her request.  Her husband listened to the telephone conversation.  She reports no complications from the procedure.  Breast MRI is scheduled for 11/13/2011.  The patient is scheduled to be evaluated in the [facility] on 11/16/2011.  Questions were answered.  The patient was encouraged to pick up educational materials at our office.  IMPRESSION:  Ultrasound-guided biopsy of two masses in the left upper outer quadrant and an abnormal left axillary lymph node. The mass at 1 o'clock is concordant with invasive ductal carcinoma with lymphovascular invasion.  The mass at 1:30 o'clock he is a benign hyalinized fibroadenoma.  The left axillary lymph node demonstrates no evidence of carcinoma.  Breast MRI and [facility] scheduled.  No apparent complications.   Original Report Authenticated By: Daryl Eastern, M.D.    Mm Digital Diagnostic Bilat  11/07/2011  *RADIOLOGY REPORT*  Clinical Data:  The patient experienced pain in the left upper outer quadrant that resolved following the cessation of caffeine intake and avoidance of workout machines.  DIGITAL DIAGNOSTIC BILATERAL MAMMOGRAM WITH CAD AND LEFT BREAST ULTRASOUND:   Comparison:  10/07/2010, 10/05/2009, 09/16/2008  Findings:  There are scattered fibroglandular densities.  There is a 1.8 cm irregular mass in the left upper outer quadrant posteriorly.  No abnormality is noted on the right. Mammographic images were processed with CAD.  On physical exam, no mass is palpated in the left upper outer quadrant.  Ultrasound is performed, showing an irregular hypoechoic mass at 1 o'clock 10 cm from the right nipple measuring at least 0.9 x 0.9 x 1.4 cm.  There is a second oval mass at 1:30 o'clock, 7 cm from the right nipple measuring approximately 1.2 x 1.2 x 0.4 cm.  There is an abnormal left axillary lymph node with markedly thickened cortex and compression of the normal echogenic hilum.  The appearance is suspicious for invasive mammary carcinoma with possible axillary metastasis.  Biopsy  is recommended.  Options of surgical excisional biopsy and ultrasound-guided core needle biopsy were discussed with the patient.  I suggested ultrasound-guided core needle biopsy and the patient agreed with this plan.  IMPRESSION: Two suspicious masses in the left upper outer quadrant with suspicious left axillary lymph node.  RECOMMENDATION: Ultrasound-guided core needle biopsy is recommended.  This will be performed and reported separately.  BI-RADS CATEGORY 5:  Highly suggestive of malignancy - appropriate action should be taken.   Original Report Authenticated By: Daryl Eastern, M.D.    Mm Digital Diagnostic Unilat L  11/07/2011  *RADIOLOGY REPORT*  Clinical Data:  Ultrasound-guided core needle biopsy of two masses in the left upper outer quadrant with clip placement.  DIGITAL DIAGNOSTIC LEFT MAMMOGRAM  Comparison:  Previous exams.  Findings:  Films are performed following ultrasound guided biopsy of a mass at 1 o'clock 10 cm from the left nipple and at 1:30 o'clock, 7 cm from the left nipple.  The ribbon clip is appropriately positioned within the mass at 1 o'clock.  The coil clip is  appropriately positioned within the mass at 1:30 o'clock.  IMPRESSION: Appropriate clip placement following ultrasound-guided core needle biopsy of two masses in the left upper outer quadrant.   Original Report Authenticated By: Daryl Eastern, M.D.      LABS:    Chemistry      Component Value Date/Time   NA 141 11/16/2011 1243   K 4.0 11/16/2011 1243   CL 105 11/16/2011 1243   CO2 24 11/16/2011 1243   BUN 11.0 11/16/2011 1243   CREATININE 0.9 11/16/2011 1243      Component Value Date/Time   CALCIUM 9.9 11/16/2011 1243   ALKPHOS 91 11/16/2011 1243   AST 19 11/16/2011 1243   ALT 18 11/16/2011 1243   BILITOT 0.60 11/16/2011 1243      Lab Results  Component Value Date   WBC 5.3 11/16/2011   HGB 13.6 11/16/2011   HCT 40.7 11/16/2011   MCV 81.4 11/16/2011   PLT 261 11/16/2011   PATHOLOGY: ADDITIONAL INFORMATION: 1. PROGNOSTIC INDICATORS - ACIS Results IMMUNOHISTOCHEMICAL AND MORPHOMETRIC ANALYSIS BY THE AUTOMATED CELLULAR IMAGING SYSTEM (ACIS) Estrogen Receptor (Negative, <1%): 100%, STRONG STAINING INTENSITY Progesterone Receptor (Negative, <1%): 63%, STRONG STAINING INTENSITY Proliferation Marker Ki67 by M IB-1 (Low<20%): 62% All controls stained appropriately [name] MD Pathologist, Electronic Signature ( Signed 11/11/2011) 1. CHROMOGENIC IN-SITU HYBRIDIZATION Interpretation HER-2/NEU BY CISH - NO AMPLIFICATION OF HER-2 DETECTED. THE RATIO OF HER-2: CEP 17 SIGNALS WAS 1.56. Reference range: Ratio: HER2:CEP17 < 1.8 - gene amplification not observed Ratio: HER2:CEP 17 1.8-2.2 - equivocal result Ratio: HER2:CEP17 > 2.2 - gene amplification observed [name] MD Pathologist, Electronic Signature ( Signed 11/10/2011) 1 of 3 FINAL for Katherine Coleman, Katherine Coleman [phone]) FINAL DIAGNOSIS Diagnosis 1. Breast, left, needle core biopsy, mass, 1 o'clock, 10 cm / left nipple - INVASIVE DUCTAL CARCINOMA. - LYMPHOVASCULAR INVASION IS IDENTIFIED. - SEE COMMENT. 2. Breast,  left, needle core biopsy, mass, 1:30 o'clock, 8 cm / left nipple - HYALINIZED FIBROADENOMA. - THERE IS NO EVIDENCE OF MALIGNANCY. 3. Lymph node, needle/core biopsy, left axillary - THERE IS NO EVIDENCE OF CARCINOMA IN 1 OF 1 LYMPH NODE (0/1). Microscopic Comment 1. Although grade is best determined at time of surgical excision, the carcinoma appears Grade 2. A breast prognostic profile will be performed and the results reported separately. The results were called to the [facility] on 11/08/2011. 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 Patient is a good candidate for breast conservation.  #2 adjuvantly patient will definitely require antiestrogen therapy however I did discuss with her Oncotype testing on her final tumor specimen to decide whether she will need chemotherapy are not.  #3 patient will receive radiation if she undergoes a lumpectomy and she was seen by Dr. Dorothy Puffer.     PLAN:    #1 patient will proceed with lumpectomy with sentinel lymph node biopsy.  #2 we will send the tumor for Oncotype testing to determine her breast recurrence score to decide whether or not she would benefit from adjuvant chemotherapy. Since patient's tumor is ER positive she will definitely need adjuvant antiestrogen therapy. She is postmenopausal and we would recommend an aromatase inhibitor.     Thank you so much for allowing me to participate in the care of Katherine Coleman. I will continue to follow up the patient with you and assist in her care.  All questions were answered. The patient knows to call the clinic with any problems, questions or concerns. We can certainly see the patient much sooner if necessary.  I spent 60 minutes counseling the patient face to face.  The total time spent in the appointment was 60 minutes.  Drue Second, MD Medical/Oncology [facility] [phone] (beeper) [phone] (Office)  11/16/2011, 1:52 PM

## 2011-11-16 NOTE — Progress Notes (Signed)
Patient ID: Tyreona Panjwani, female   DOB: 1955/08/25, 56 y.o.   MRN: [id number]  Chief Complaint  Patient presents with  . Other    left breast cancer    HPI Ndia Sampath is a 56 y.o. female.   HPI She is referred by Dr. Oletha Blend After recent mammography demonstrated abnormal calcifications in the left breast. Biopsy demonstrated invasive cancer so  she has been referred for surgical consideration.  She has had no previous problems regarding her breast other than pain. She denies nipple discharge. She has had no previous surgery on her breasts or history of breast cancer. Past Medical History  Diagnosis Date  . Fibroid   . Breast cancer     Past Surgical History  Procedure Date  . Myomectomy     Family History  Problem Relation Age of Onset  . Hypertension Mother   . Hypertension Father   . Prostate cancer Father   . Cancer Paternal Aunt     Colon cancer    Social History History  Substance Use Topics  . Smoking status: Never Smoker   . Smokeless tobacco: Not on file  . Alcohol Use: Yes     occas    Allergies  Allergen Reactions  . Penicillins     Current Outpatient Prescriptions  Medication Sig Dispense Refill  . Calcium Carbonate-Vitamin D (CALCIUM + D PO) Take by mouth.        . Cholecalciferol (VITAMIN D PO) Take 1,000 Units by mouth.        . Loratadine (CLARITIN PO) Take by mouth.        . Multiple Vitamin (MULTIVITAMIN) tablet Take 1 tablet by mouth daily.        Marland Kitchen VITAMIN E PO Take by mouth.          Review of Systems Review of Systems  Constitutional: Negative for fever, chills and unexpected weight change.  HENT: Negative for hearing loss, congestion, sore throat, trouble swallowing and voice change.   Eyes: Negative for visual disturbance.  Respiratory: Negative for cough and wheezing.   Cardiovascular: Negative for chest pain, palpitations and leg swelling.  Gastrointestinal: Negative for nausea, vomiting, abdominal pain, diarrhea,  constipation, blood in stool, abdominal distention and anal bleeding.  Genitourinary: Negative for hematuria, vaginal bleeding and difficulty urinating.  Musculoskeletal: Negative for arthralgias.  Skin: Negative for rash and wound.  Neurological: Negative for seizures, syncope and headaches.  Hematological: Negative for adenopathy. Does not bruise/bleed easily.  Psychiatric/Behavioral: Negative for confusion.    Physical Exam Physical Exam  Constitutional: She is oriented to person, place, and time. She appears well-developed and well-nourished. No distress.  HENT:  Head: Normocephalic and atraumatic.  Right Ear: External ear normal.  Left Ear: External ear normal.  Nose: Nose normal.  Mouth/Throat: Oropharynx is clear and moist.  Eyes: Conjunctivae normal and EOM are normal. Pupils are equal, round, and reactive to light. Right eye exhibits no discharge. Left eye exhibits no discharge. No scleral icterus.  Neck: Normal range of motion. Neck supple. No tracheal deviation present. No thyromegaly present.  Cardiovascular: Normal rate, regular rhythm and intact distal pulses.   Murmur heard. Pulmonary/Chest: Effort normal and breath sounds normal. No respiratory distress. She has no wheezes. She has no rales.  Abdominal: Soft. She exhibits no distension. There is no tenderness.  Musculoskeletal: Normal range of motion. She exhibits no edema and no tenderness.  Lymphadenopathy:    She has no cervical adenopathy.  Neurological: She is alert and oriented  to person, place, and time.  Skin: Skin is warm and dry. No rash noted. She is not diaphoretic. No erythema.  Psychiatric: Her behavior is normal. Judgment normal.    Data Reviewed The mammograms MRI have been reviewed as well as the pathology results.  This is a grade 2 ER and PR positive invasive left breast cancer  Assessment    Invasive left breast cancer.    Plan    I had long discussion with the patient and her husband.  After discussion in the breast cancer conference, We are recommending a left breast needle localized lumpectomy and sentinel lymph node biopsy. I did discussed breast conservation versus mastectomy and she wishes to proceed with breast conservation. I discussed the risks of surgery which includes is not limited to bleeding, infection, need for further surgery should the margins or nodes be positive, injury to surrounding structures, seroma formation, Chronic pain, arm swelling, etc. She understands and wished to proceed. Surgery will be scheduled       Carlin Mamone A 11/16/2011, 2:33 PM

## 2011-11-16 NOTE — Patient Instructions (Addendum)
suregery  oncotype dx testing  Radiation

## 2011-11-16 NOTE — Telephone Encounter (Signed)
gve the pt her nov 2013 appt calendar °

## 2011-11-17 ENCOUNTER — Other Ambulatory Visit (INDEPENDENT_AMBULATORY_CARE_PROVIDER_SITE_OTHER): Payer: Self-pay | Admitting: Surgery

## 2011-11-17 DIAGNOSIS — C50912 Malignant neoplasm of unspecified site of left female breast: Secondary | ICD-10-CM

## 2011-11-17 LAB — CANCER ANTIGEN 27.29: CA 27.29: 29 U/mL (ref 0–39)

## 2011-11-18 ENCOUNTER — Encounter: Payer: Self-pay | Admitting: *Deleted

## 2011-11-21 ENCOUNTER — Encounter (HOSPITAL_BASED_OUTPATIENT_CLINIC_OR_DEPARTMENT_OTHER): Payer: Self-pay | Admitting: *Deleted

## 2011-11-21 NOTE — Progress Notes (Signed)
No labs needed

## 2011-11-24 NOTE — H&P (Signed)
Patient ID: Katherine Coleman, female DOB: 05-Aug-1955, 56 y.o. MRN: [id number]  Chief Complaint   Patient presents with   .  Other     left breast cancer    HPI  Katherine Coleman is a 56 y.o. female.  HPI  She is referred by Dr. Oletha Blend After recent mammography demonstrated abnormal calcifications in the left breast. Biopsy demonstrated invasive cancer so she has been referred for surgical consideration. She has had no previous problems regarding her breast other than pain. She denies nipple discharge. She has had no previous surgery on her breasts or history of breast cancer.  Past Medical History   Diagnosis  Date   .  Fibroid    .  Breast cancer     Past Surgical History   Procedure  Date   .  Myomectomy     Family History   Problem  Relation  Age of Onset   .  Hypertension  Mother    .  Hypertension  Father    .  Prostate cancer  Father    .  Cancer  Paternal Aunt       Colon cancer    Social History  History   Substance Use Topics   .  Smoking status:  Never Smoker   .  Smokeless tobacco:  Not on file   .  Alcohol Use:  Yes      occas    Allergies   Allergen  Reactions   .  Penicillins     Current Outpatient Prescriptions   Medication  Sig  Dispense  Refill   .  Calcium Carbonate-Vitamin D (CALCIUM + D PO)  Take by mouth.     .  Cholecalciferol (VITAMIN D PO)  Take 1,000 Units by mouth.     .  Loratadine (CLARITIN PO)  Take by mouth.     .  Multiple Vitamin (MULTIVITAMIN) tablet  Take 1 tablet by mouth daily.     Marland Kitchen  VITAMIN E PO  Take by mouth.      Review of Systems  Review of Systems  Constitutional: Negative for fever, chills and unexpected weight change.  HENT: Negative for hearing loss, congestion, sore throat, trouble swallowing and voice change.  Eyes: Negative for visual disturbance.  Respiratory: Negative for cough and wheezing.  Cardiovascular: Negative for chest pain, palpitations and leg swelling.  Gastrointestinal: Negative for nausea,  vomiting, abdominal pain, diarrhea, constipation, blood in stool, abdominal distention and anal bleeding.  Genitourinary: Negative for hematuria, vaginal bleeding and difficulty urinating.  Musculoskeletal: Negative for arthralgias.  Skin: Negative for rash and wound.  Neurological: Negative for seizures, syncope and headaches.  Hematological: Negative for adenopathy. Does not bruise/bleed easily.  Psychiatric/Behavioral: Negative for confusion.   Physical Exam  Physical Exam  Constitutional: She is oriented to person, place, and time. She appears well-developed and well-nourished. No distress.  HENT:  Head: Normocephalic and atraumatic.  Right Ear: External ear normal.  Left Ear: External ear normal.  Nose: Nose normal.  Mouth/Throat: Oropharynx is clear and moist.  Eyes: Conjunctivae normal and EOM are normal. Pupils are equal, round, and reactive to light. Right eye exhibits no discharge. Left eye exhibits no discharge. No scleral icterus.  Neck: Normal range of motion. Neck supple. No tracheal deviation present. No thyromegaly present.  Cardiovascular: Normal rate, regular rhythm and intact distal pulses.  Murmur heard.  Pulmonary/Chest: Effort normal and breath sounds normal. No respiratory distress. She has no wheezes. She has no rales.  Abdominal: Soft. She exhibits no distension. There is no tenderness.  Musculoskeletal: Normal range of motion. She exhibits no edema and no tenderness.  Lymphadenopathy:  She has no cervical adenopathy.  Neurological: She is alert and oriented to person, place, and time.  Skin: Skin is warm and dry. No rash noted. She is not diaphoretic. No erythema.  Psychiatric: Her behavior is normal. Judgment normal.   Data Reviewed  The mammograms MRI have been reviewed as well as the pathology results. This is a grade 2 ER and PR positive invasive left breast cancer  Assessment   Invasive left breast cancer.   Plan   I had long discussion with the  patient and her husband. After discussion in the breast cancer conference, We are recommending a left breast needle localized lumpectomy and sentinel lymph node biopsy. I did discussed breast conservation versus mastectomy and she wishes to proceed with breast conservation. I discussed the risks of surgery which includes is not limited to bleeding, infection, need for further surgery should the margins or nodes be positive, injury to surrounding structures, seroma formation, Chronic pain, arm swelling, etc. She understands and wished to proceed. Surgery will be scheduled   Duayne Brideau A

## 2011-11-25 ENCOUNTER — Ambulatory Visit
Admission: RE | Admit: 2011-11-25 | Discharge: 2011-11-25 | Disposition: A | Discharge status: Home / Self Care | Payer: Managed Care, Other (non HMO) | Source: Ambulatory Visit | Attending: Surgery | Admitting: Surgery

## 2011-11-25 ENCOUNTER — Other Ambulatory Visit (INDEPENDENT_AMBULATORY_CARE_PROVIDER_SITE_OTHER): Payer: Self-pay | Admitting: Surgery

## 2011-11-25 ENCOUNTER — Encounter (HOSPITAL_BASED_OUTPATIENT_CLINIC_OR_DEPARTMENT_OTHER): Payer: Self-pay | Admitting: Anesthesiology

## 2011-11-25 ENCOUNTER — Ambulatory Visit (HOSPITAL_COMMUNITY)
Admission: RE | Admit: 2011-11-25 | Discharge: 2011-11-25 | Disposition: A | Discharge status: Home / Self Care | Payer: Managed Care, Other (non HMO) | Source: Ambulatory Visit | Attending: Surgery | Admitting: Surgery

## 2011-11-25 ENCOUNTER — Ambulatory Visit (HOSPITAL_BASED_OUTPATIENT_CLINIC_OR_DEPARTMENT_OTHER)
Admission: RE | Admit: 2011-11-25 | Discharge: 2011-11-25 | Disposition: A | Discharge status: Home / Self Care | Payer: Managed Care, Other (non HMO) | Source: Ambulatory Visit | Attending: Surgery | Admitting: Surgery

## 2011-11-25 ENCOUNTER — Ambulatory Visit (HOSPITAL_BASED_OUTPATIENT_CLINIC_OR_DEPARTMENT_OTHER): Payer: Managed Care, Other (non HMO) | Admitting: Anesthesiology

## 2011-11-25 ENCOUNTER — Encounter (HOSPITAL_BASED_OUTPATIENT_CLINIC_OR_DEPARTMENT_OTHER)
Admission: RE | Disposition: A | Discharge status: Home / Self Care | Payer: Self-pay | Source: Ambulatory Visit | Attending: Surgery

## 2011-11-25 ENCOUNTER — Encounter (HOSPITAL_COMMUNITY): Payer: Managed Care, Other (non HMO)

## 2011-11-25 ENCOUNTER — Encounter (HOSPITAL_BASED_OUTPATIENT_CLINIC_OR_DEPARTMENT_OTHER): Payer: Self-pay

## 2011-11-25 DIAGNOSIS — C773 Secondary and unspecified malignant neoplasm of axilla and upper limb lymph nodes: Secondary | ICD-10-CM | POA: Insufficient documentation

## 2011-11-25 DIAGNOSIS — D0590 Unspecified type of carcinoma in situ of unspecified breast: Secondary | ICD-10-CM | POA: Insufficient documentation

## 2011-11-25 DIAGNOSIS — C50912 Malignant neoplasm of unspecified site of left female breast: Secondary | ICD-10-CM

## 2011-11-25 DIAGNOSIS — C50419 Malignant neoplasm of upper-outer quadrant of unspecified female breast: Secondary | ICD-10-CM | POA: Insufficient documentation

## 2011-11-25 HISTORY — PX: BREAST LUMPECTOMY: SHX2

## 2011-11-25 HISTORY — PX: BREAST SURGERY: SHX581

## 2011-11-25 SURGERY — BREAST LUMPECTOMY WITH NEEDLE LOCALIZATION AND AXILLARY SENTINEL LYMPH NODE BX
Anesthesia: General | Site: Breast | Laterality: Left | Wound class: Clean

## 2011-11-25 MED ORDER — SODIUM CHLORIDE 0.9 % IV SOLN: 250.0000 mL | INTRAVENOUS | Status: DC | PRN

## 2011-11-25 MED ORDER — FENTANYL CITRATE 0.05 MG/ML IJ SOLN
50.0000 ug | Freq: Once | INTRAMUSCULAR | Status: AC
  Administered 2011-11-25: 100 ug via INTRAVENOUS

## 2011-11-25 MED ORDER — ONDANSETRON HCL 4 MG/2ML IJ SOLN: 4.0000 mg | Freq: Four times a day (QID) | INTRAMUSCULAR | Status: DC | PRN

## 2011-11-25 MED ORDER — ACETAMINOPHEN 650 MG RE SUPP: 650.0000 mg | RECTAL | Status: DC | PRN

## 2011-11-25 MED ORDER — MIDAZOLAM HCL 5 MG/5ML IJ SOLN
INTRAMUSCULAR | Status: DC | PRN
  Administered 2011-11-25: 1 mg via INTRAVENOUS

## 2011-11-25 MED ORDER — PROPOFOL 10 MG/ML IV BOLUS
INTRAVENOUS | Status: DC | PRN
  Administered 2011-11-25: 200 mg via INTRAVENOUS
  Administered 2011-11-25: 100 mg via INTRAVENOUS
  Administered 2011-11-25: 50 mg via INTRAVENOUS

## 2011-11-25 MED ORDER — BUPIVACAINE-EPINEPHRINE 0.25% -1:200000 IJ SOLN
INTRAMUSCULAR | Status: DC | PRN
  Administered 2011-11-25: 20 mL

## 2011-11-25 MED ORDER — CIPROFLOXACIN IN D5W 400 MG/200ML IV SOLN
400.0000 mg | INTRAVENOUS | Status: AC
  Administered 2011-11-25 (×2): 400 mg via INTRAVENOUS

## 2011-11-25 MED ORDER — OXYCODONE HCL 5 MG PO TABS: 5.0000 mg | ORAL_TABLET | ORAL | Status: DC | PRN

## 2011-11-25 MED ORDER — ACETAMINOPHEN 10 MG/ML IV SOLN
INTRAVENOUS | Status: DC | PRN
  Administered 2011-11-25: 1000 mg via INTRAVENOUS

## 2011-11-25 MED ORDER — TECHNETIUM TC 99M SULFUR COLLOID FILTERED
1.0000 | Freq: Once | INTRAVENOUS | Status: AC | PRN
  Administered 2011-11-25: 1 via INTRADERMAL

## 2011-11-25 MED ORDER — SUCCINYLCHOLINE CHLORIDE 20 MG/ML IJ SOLN
INTRAMUSCULAR | Status: DC | PRN
  Administered 2011-11-25: 100 mg via INTRAVENOUS

## 2011-11-25 MED ORDER — MORPHINE SULFATE 4 MG/ML IJ SOLN: 4.0000 mg | INTRAMUSCULAR | Status: DC | PRN

## 2011-11-25 MED ORDER — MIDAZOLAM HCL 2 MG/2ML IJ SOLN
1.0000 mg | INTRAMUSCULAR | Status: DC | PRN
  Administered 2011-11-25: 2 mg via INTRAVENOUS

## 2011-11-25 MED ORDER — OXYCODONE HCL 5 MG PO TABS: 5.0000 mg | ORAL_TABLET | Freq: Once | ORAL | Status: DC | PRN

## 2011-11-25 MED ORDER — SODIUM CHLORIDE 0.9 % IJ SOLN: 3.0000 mL | INTRAMUSCULAR | Status: DC | PRN

## 2011-11-25 MED ORDER — OXYCODONE HCL 5 MG/5ML PO SOLN: 5.0000 mg | Freq: Once | ORAL | Status: DC | PRN

## 2011-11-25 MED ORDER — LIDOCAINE HCL (CARDIAC) 20 MG/ML IV SOLN
INTRAVENOUS | Status: DC | PRN
  Administered 2011-11-25: 75 mg via INTRAVENOUS

## 2011-11-25 MED ORDER — ONDANSETRON HCL 4 MG/2ML IJ SOLN
INTRAMUSCULAR | Status: DC | PRN
  Administered 2011-11-25: 4 mg via INTRAVENOUS

## 2011-11-25 MED ORDER — HYDROCODONE-ACETAMINOPHEN 5-325 MG PO TABS: 1.0000 | ORAL_TABLET | ORAL | Status: DC | PRN

## 2011-11-25 MED ORDER — LACTATED RINGERS IV SOLN
INTRAVENOUS | Status: DC
  Administered 2011-11-25: 12:00:00 via INTRAVENOUS
  Administered 2011-11-25: 10 mL/h via INTRAVENOUS

## 2011-11-25 MED ORDER — DEXAMETHASONE SODIUM PHOSPHATE 4 MG/ML IJ SOLN
INTRAMUSCULAR | Status: DC | PRN
  Administered 2011-11-25: 10 mg via INTRAVENOUS

## 2011-11-25 MED ORDER — HYDROMORPHONE HCL PF 1 MG/ML IJ SOLN
0.2500 mg | INTRAMUSCULAR | Status: DC | PRN
  Administered 2011-11-25: 0.5 mg via INTRAVENOUS

## 2011-11-25 MED ORDER — PHENYLEPHRINE HCL 10 MG/ML IJ SOLN
INTRAMUSCULAR | Status: DC | PRN
  Administered 2011-11-25 (×2): 40 ug via INTRAVENOUS

## 2011-11-25 MED ORDER — FENTANYL CITRATE 0.05 MG/ML IJ SOLN
INTRAMUSCULAR | Status: DC | PRN
  Administered 2011-11-25: 50 ug via INTRAVENOUS

## 2011-11-25 MED ORDER — SODIUM CHLORIDE 0.9 % IJ SOLN: 3.0000 mL | Freq: Two times a day (BID) | INTRAMUSCULAR | Status: DC

## 2011-11-25 MED ORDER — SODIUM CHLORIDE 0.9 % IJ SOLN
INTRAMUSCULAR | Status: DC | PRN
  Administered 2011-11-25: 13:00:00 via INTRAMUSCULAR

## 2011-11-25 MED ORDER — ACETAMINOPHEN 325 MG PO TABS: 650.0000 mg | ORAL_TABLET | ORAL | Status: DC | PRN

## 2011-11-25 SURGICAL SUPPLY — 47 items
APL SKNCLS STERI-STRIP NONHPOA (GAUZE/BANDAGES/DRESSINGS) ×1
APPLIER CLIP 9.375 MED OPEN (MISCELLANEOUS) ×2
APR CLP MED 9.3 20 MLT OPN (MISCELLANEOUS) ×1
BENZOIN TINCTURE PRP APPL 2/3 (GAUZE/BANDAGES/DRESSINGS) ×2 IMPLANT
BINDER BREAST LRG (GAUZE/BANDAGES/DRESSINGS) IMPLANT
BINDER BREAST XLRG (GAUZE/BANDAGES/DRESSINGS) IMPLANT
BLADE HEX COATED 2.75 (ELECTRODE) ×2 IMPLANT
BLADE SURG 15 STRL LF DISP TIS (BLADE) ×1 IMPLANT
BLADE SURG 15 STRL SS (BLADE) ×2
CANISTER SUCTION 2500CC (MISCELLANEOUS) ×2 IMPLANT
CHLORAPREP W/TINT 26ML (MISCELLANEOUS) ×2 IMPLANT
CLIP APPLIE 9.375 MED OPEN (MISCELLANEOUS) IMPLANT
CLOTH BEACON ORANGE TIMEOUT ST (SAFETY) ×2 IMPLANT
COVER MAYO STAND STRL (DRAPES) ×2 IMPLANT
COVER PROBE W GEL 5X96 (DRAPES) ×2 IMPLANT
COVER TABLE BACK 60X90 (DRAPES) ×2 IMPLANT
DEVICE DUBIN W/COMP PLATE 8390 (MISCELLANEOUS) ×1 IMPLANT
DRAPE LAPAROSCOPIC ABDOMINAL (DRAPES) ×2 IMPLANT
DRAPE UTILITY XL STRL (DRAPES) ×2 IMPLANT
DRSG TEGADERM 4X4.75 (GAUZE/BANDAGES/DRESSINGS) ×4 IMPLANT
ELECT REM PT RETURN 9FT ADLT (ELECTROSURGICAL) ×2
ELECTRODE REM PT RTRN 9FT ADLT (ELECTROSURGICAL) ×1 IMPLANT
GLOVE BIO SURGEON STRL SZ7 (GLOVE) ×1 IMPLANT
GLOVE ECLIPSE 6.5 STRL STRAW (GLOVE) ×1 IMPLANT
GLOVE SURG SIGNA 7.5 PF LTX (GLOVE) ×2 IMPLANT
GOWN PREVENTION PLUS XLARGE (GOWN DISPOSABLE) ×2 IMPLANT
GOWN STRL NON-REIN LRG LVL3 (GOWN DISPOSABLE) ×2 IMPLANT
KIT MARKER MARGIN INK (KITS) ×1 IMPLANT
NDL HYPO 25X1 1.5 SAFETY (NEEDLE) ×2 IMPLANT
NDL SAFETY ECLIPSE 18X1.5 (NEEDLE) ×1 IMPLANT
NEEDLE HYPO 18GX1.5 SHARP (NEEDLE) ×2
NEEDLE HYPO 25X1 1.5 SAFETY (NEEDLE) ×4 IMPLANT
NS IRRIG 1000ML POUR BTL (IV SOLUTION) ×2 IMPLANT
PACK BASIN DAY SURGERY FS (CUSTOM PROCEDURE TRAY) ×2 IMPLANT
PENCIL BUTTON HOLSTER BLD 10FT (ELECTRODE) ×2 IMPLANT
SPONGE GAUZE 4X4 12PLY (GAUZE/BANDAGES/DRESSINGS) ×2 IMPLANT
SPONGE LAP 4X18 X RAY DECT (DISPOSABLE) ×2 IMPLANT
STRIP CLOSURE SKIN 1/2X4 (GAUZE/BANDAGES/DRESSINGS) ×2 IMPLANT
SUT MON AB 4-0 PC3 18 (SUTURE) ×2 IMPLANT
SUT SILK 2 0 SH (SUTURE) IMPLANT
SUT VIC AB 3-0 SH 27 (SUTURE) ×2
SUT VIC AB 3-0 SH 27X BRD (SUTURE) ×1 IMPLANT
SYR BULB 3OZ (MISCELLANEOUS) ×2 IMPLANT
SYR CONTROL 10ML LL (SYRINGE) ×4 IMPLANT
TOWEL OR 17X24 6PK STRL BLUE (TOWEL DISPOSABLE) ×2 IMPLANT
TUBE CONNECTING 12X1/4 (SUCTIONS) ×2 IMPLANT
YANKAUER SUCT BULB TIP NO VENT (SUCTIONS) ×2 IMPLANT

## 2011-11-25 NOTE — Anesthesia Postprocedure Evaluation (Signed)
Anesthesia Post Note  Patient: Katherine Coleman  Procedure(s) Performed: Procedure(s) (LRB): BREAST LUMPECTOMY WITH NEEDLE LOCALIZATION AND AXILLARY SENTINEL LYMPH NODE BX (Left)  Anesthesia type: General  Patient location: PACU  Post pain: Pain level controlled and Adequate analgesia  Post assessment: Post-op Vital signs reviewed, Patient's Cardiovascular Status Stable, Respiratory Function Stable, Patent Airway and Pain level controlled  Last Vitals:  Filed Vitals:   11/25/11 1415  BP: 99/55  Pulse: 65  Temp:   Resp: 16    Post vital signs: Reviewed and stable  Level of consciousness: awake, alert  and oriented  Complications: No apparent anesthesia complications

## 2011-11-25 NOTE — Anesthesia Procedure Notes (Signed)
Procedure Name: Intubation Date/Time: 11/25/2011 1:14 PM Performed by: Zenia Resides D Pre-anesthesia Checklist: Patient identified, Emergency Drugs available, Suction available and Patient being monitored Patient Re-evaluated:Patient Re-evaluated prior to inductionOxygen Delivery Method: Circle System Utilized Preoxygenation: Pre-oxygenation with 100% oxygen Intubation Type: IV induction Ventilation: Mask ventilation without difficulty Laryngoscope Size: Mac and 3 Grade View: Grade II Tube type: Oral Tube size: 7.0 mm Number of attempts: 1 Airway Equipment and Method: stylet and oral airway Placement Confirmation: ETT inserted through vocal cords under direct vision,  positive ETCO2 and breath sounds checked- equal and bilateral Secured at: 22 cm Tube secured with: Tape Dental Injury: Teeth and Oropharynx as per pre-operative assessment

## 2011-11-25 NOTE — Transfer of Care (Signed)
Immediate Anesthesia Transfer of Care Note  Patient: Katherine Coleman  Procedure(s) Performed: Procedure(s) (LRB) with comments: BREAST LUMPECTOMY WITH NEEDLE LOCALIZATION AND AXILLARY SENTINEL LYMPH NODE BX (Left) - Left breast needle localization lumpectomy and Left senitel lymph node biospy.   Patient Location: PACU  Anesthesia Type: General  Level of Consciousness: sedated  Airway & Oxygen Therapy: Patient Spontanous Breathing and Patient connected to face mask oxygen  Post-op Assessment: Report given to PACU RN and Post -op Vital signs reviewed and stable  Post vital signs: Reviewed and stable  Complications: No apparent anesthesia complications

## 2011-11-25 NOTE — Op Note (Signed)
BREAST LUMPECTOMY WITH NEEDLE LOCALIZATION AND AXILLARY SENTINEL LYMPH NODE BX  Procedure Note  Katherine Coleman 11/25/2011   Pre-op Diagnosis: Left breast cancer     Post-op Diagnosis: same  Procedure(s): LEFT BREAST LUMPECTOMY WITH NEEDLE LOCALIZATION AND AXILLARY SENTINEL LYMPH NODE BX INJECTION OF BLUE DYE  Surgeon(s): Shelly Rubenstein, MD  Anesthesia: General  Staff:  Raliegh Scarlet, RN - Circulator Randalyn Rhea, RN - Scrub Person  Estimated Blood Loss: Minimal               Specimens: left breast lumpectomy, further medial margin, node x 1 sent to path         Indications: This is a 56 year old female now to have an invasive cancer of the left breast. The decision has been made to proceed with needle localized lumpectomy and sentinel biopsy.  Findings: The abnormal area and clipped was found to be in the biopsy specimen. Further medial margin was resected. One sentinel lymph node was identified in the axilla  Procedure: The patient had a localization wire placed in the left breast at the breast center. She was brought to the holding area. Radioactive isotope was injected around the areola of the left breast. The patient was then taken to the operating room. She's identify the correct patient and placed supine on the operating table. Gen. Anesthesia was then induced. Blue dye was then injected underneath the nipple of the left breast and the breast was massaged. The patient's left breast, axilla, and chest were prepped and draped in the usual sterile fashion. I made an elliptical incision around the localization wire entered this down into the breast tissue with the electrocautery. A wide lumpectomy was then performed going down to the chest wall.  The specimen was then x-rayed and the suspicious area was confirmed to be in the specimen. I decided to take further medial margin which was sent to pathology separately. Wide margins around the tumor appeared to be achieved. The  neoprobe was brought to the field. I identified an area of increased uptake in the left axilla. I was able to get to this area to the breast incision. I identified one sentinel lymph node with uptake of both radioisotope and blue dye. This was excised and sent to pathology. No other enlarged lymph nodes were identified in the axilla. I thoroughly irrigated the incision with saline. I anesthetized it with Marcaine. I placed surgical clips and the biopsy bed. I then closed the subcutaneous tissue with interrupted 3-0 Vicryl sutures and closed the skin with a running 4-0 Monocryl. Steri-Strips, gauze, and tape were then applied. The patient tolerated the procedure well. All the counts were correct at the end of the procedure. The patient was then extubated in the operating room and taken in a stable condition to the recovery room. [name] A   Date: 11/25/2011  Time: 1:51 PM

## 2011-11-25 NOTE — Anesthesia Preprocedure Evaluation (Signed)
Anesthesia Evaluation  Patient identified by MRN, date of birth, ID band Patient awake    Reviewed: Allergy & Precautions, H&P , NPO status , Patient's Chart, lab work & pertinent test results  Airway Mallampati: II  Neck ROM: full    Dental   Pulmonary          Cardiovascular     Neuro/Psych    GI/Hepatic   Endo/Other    Renal/GU      Musculoskeletal   Abdominal   Peds  Hematology   Anesthesia Other Findings   Reproductive/Obstetrics Breast CA                          Anesthesia Physical Anesthesia Plan  ASA: II  Anesthesia Plan: General   Post-op Pain Management:    Induction: Intravenous  Airway Management Planned: LMA  Additional Equipment:   Intra-op Plan:   Post-operative Plan:   Informed Consent: I have reviewed the patients History and Physical, chart, labs and discussed the procedure including the risks, benefits and alternatives for the proposed anesthesia with the patient or authorized representative who has indicated his/her understanding and acceptance.     Plan Discussed with: CRNA and Surgeon  Anesthesia Plan Comments:         Anesthesia Quick Evaluation  

## 2011-11-25 NOTE — Interval H&P Note (Signed)
History and Physical Interval Note: no change in H and P  11/25/2011 11:24 AM  Katherine Coleman  has presented today for surgery, with the diagnosis of Left breast cancer  The various methods of treatment have been discussed with the patient and family. After consideration of risks, benefits and other options for treatment, the patient has consented to  Procedure(s) (LRB) with comments: BREAST LUMPECTOMY WITH NEEDLE LOCALIZATION AND AXILLARY SENTINEL LYMPH NODE BX (Left) - Left breast needle localization lumpectomy and Left senitel lymph node biospy.  as a surgical intervention .  The patient's history has been reviewed, patient examined, no change in status, stable for surgery.  I have reviewed the patient's chart and labs.  Questions were answered to the patient's satisfaction.     Khaden Gater A

## 2011-12-09 ENCOUNTER — Encounter: Payer: Self-pay | Admitting: *Deleted

## 2011-12-09 NOTE — Progress Notes (Signed)
Dawn ordered Oncotype Dx test w/ [facility], faxed request to Path & PAC to [employer] on 12/08/11.

## 2011-12-12 ENCOUNTER — Encounter: Payer: Self-pay | Admitting: *Deleted

## 2011-12-12 NOTE — Progress Notes (Signed)
Mailed after appt letter to pt. 

## 2011-12-13 ENCOUNTER — Encounter: Payer: Self-pay | Admitting: *Deleted

## 2011-12-13 ENCOUNTER — Ambulatory Visit (INDEPENDENT_AMBULATORY_CARE_PROVIDER_SITE_OTHER): Payer: Managed Care, Other (non HMO) | Admitting: Surgery

## 2011-12-13 ENCOUNTER — Encounter (INDEPENDENT_AMBULATORY_CARE_PROVIDER_SITE_OTHER): Payer: Self-pay | Admitting: Surgery

## 2011-12-13 VITALS — BP 142/80 | HR 80 | Temp 98.1°F | Resp 14 | Ht 67.0 in | Wt 180.4 lb

## 2011-12-13 DIAGNOSIS — Z09 Encounter for follow-up examination after completed treatment for conditions other than malignant neoplasm: Secondary | ICD-10-CM

## 2011-12-13 NOTE — Progress Notes (Signed)
Re-faxed PAC to [place] at [facility] for [place].

## 2011-12-13 NOTE — Progress Notes (Signed)
Subjective:     Patient ID: Katherine Coleman, female   DOB: [birth year], 56 y.o.   MRN: [id number]  HPI She is here for a postop visit status post left breast lumpectomy and sentinel lymph node biopsy. She has no complaints. Genetic testing was just sent yesterday  Review of Systems     Objective:   Physical Exam On exam, her incision is healing well.  The final pathology showed a focal positive margin superiorly. She was brought to positive medially but the re resection of the medial margin was negative. The single sentinel lymph node had a micromet    Assessment:     Stable postop    Plan:     I discussed the pathology with the patient and her husband. I recommended reexcision of the breast cancer site as well as complete axillary dissection. We will schedule this  After the genetic testing is known

## 2011-12-21 ENCOUNTER — Encounter: Payer: Self-pay | Admitting: *Deleted

## 2011-12-21 NOTE — Progress Notes (Signed)
Received Oncotype Dx results of 16.  Gave copy to MD.  Took copy to Med Rec to scan.  

## 2011-12-28 ENCOUNTER — Other Ambulatory Visit (INDEPENDENT_AMBULATORY_CARE_PROVIDER_SITE_OTHER): Payer: Self-pay | Admitting: Surgery

## 2011-12-29 ENCOUNTER — Encounter (HOSPITAL_COMMUNITY): Payer: Self-pay | Admitting: Pharmacy Technician

## 2011-12-30 ENCOUNTER — Encounter: Payer: Self-pay | Admitting: Oncology

## 2011-12-30 ENCOUNTER — Telehealth: Payer: Self-pay | Admitting: *Deleted

## 2011-12-30 ENCOUNTER — Ambulatory Visit (HOSPITAL_BASED_OUTPATIENT_CLINIC_OR_DEPARTMENT_OTHER): Payer: Managed Care, Other (non HMO) | Admitting: Oncology

## 2011-12-30 VITALS — BP 191/98 | HR 97 | Temp 97.4°F | Resp 20 | Ht 67.0 in | Wt 178.6 lb

## 2011-12-30 DIAGNOSIS — Z170 Estrogen receptor positive status [ER+]: Secondary | ICD-10-CM

## 2011-12-30 DIAGNOSIS — C50419 Malignant neoplasm of upper-outer quadrant of unspecified female breast: Secondary | ICD-10-CM

## 2011-12-30 DIAGNOSIS — C773 Secondary and unspecified malignant neoplasm of axilla and upper limb lymph nodes: Secondary | ICD-10-CM

## 2011-12-30 NOTE — Telephone Encounter (Signed)
Gave patient appointment for 01-27-2012

## 2011-12-30 NOTE — Patient Instructions (Addendum)
Proceed with re-excision   Refer to radiation  I will see you back on 12/20

## 2011-12-30 NOTE — Progress Notes (Signed)
OFFICE PROGRESS NOTE  CC Dr. Carman Coleman Dr. Dorothy Coleman Dr. Carmelina Coleman  DIAGNOSIS: 56 year old female with stage II left invasive breast cancer  PRIOR THERAPY: 1. Patient was seen at the [facility] for new diagnosis of breast cancer. She had a 1.8 cm mass in the left breast at the 1:30 position biopsy was positive for invasive ductal carcinoma ER/PR+ Her2 negative .  2. She is now s/p lumpectomy with SNL on 10/18 which revealed a 1.5 cm grade II IDC with DCIS, ER/PR positive Her2Neu negative with Ki-67 at 62% 1 sentinel node positive for micromets.  3. Oncotype Dx testing reveals a recurrence score of 16 with 5 year distance recurrence of 10% with Tamoxifen. Putting her in the low risk category.  CURRENT THERAPY:proceed with re-excision and then radiation  INTERVAL HISTORY: Katherine Coleman 56 y.o. female returns for followup after her surgery. She tolerated the surgery well. She is without any complaints. Overall she is doing well. No c/o of N/V/D no fevers or chills, no headaches. No pain. Remainder of the review of systems is negative.  MEDICAL HISTORY: Past Medical History  Diagnosis Date  . Fibroid   . Breast cancer   . No pertinent past medical history     ALLERGIES:  is allergic to penicillins.  MEDICATIONS:  Current Outpatient Prescriptions  Medication Sig Dispense Refill  . loratadine (CLARITIN) 10 MG tablet Take 10 mg by mouth daily.      . Multiple Vitamin (MULTIVITAMIN) tablet Take 1 tablet by mouth daily.          SURGICAL HISTORY:  Past Surgical History  Procedure Date  . Myomectomy   . Colonoscopy   . Breast surgery     REVIEW OF SYSTEMS:  A comprehensive review of systems was negative.   HEALTH MAINTENANCE:  PHYSICAL EXAMINATION: Blood pressure 191/98, pulse 97, temperature 97.4 F (36.3 C), temperature source Oral, resp. rate 20, height 5\' 7"  (1.702 m), weight 178 lb 9.6 oz (81.012 kg). Body mass index is 27.97 kg/(m^2). ECOG PERFORMANCE STATUS:  0 - Asymptomatic   General appearance: alert, cooperative and appears stated age Neck: no adenopathy, no carotid bruit, no JVD, supple, symmetrical, trachea midline and thyroid not enlarged, symmetric, no tenderness/mass/nodules Lymph nodes: Cervical, supraclavicular, and axillary nodes normal. Resp: clear to auscultation bilaterally Back: symmetric, no curvature. ROM normal. No CVA tenderness. Cardio: regular rate and rhythm GI: soft, non-tender; bowel sounds normal; no masses,  no organomegaly Extremities: extremities normal, atraumatic, no cyanosis or edema Neurologic: Grossly normal   LABORATORY DATA: Lab Results  Component Value Date   WBC 5.3 11/16/2011   HGB 13.6 11/16/2011   HCT 40.7 11/16/2011   MCV 81.4 11/16/2011   PLT 261 11/16/2011      Chemistry      Component Value Date/Time   NA 141 11/16/2011 1243   K 4.0 11/16/2011 1243   CL 105 11/16/2011 1243   CO2 24 11/16/2011 1243   BUN 11.0 11/16/2011 1243   CREATININE 0.9 11/16/2011 1243      Component Value Date/Time   CALCIUM 9.9 11/16/2011 1243   ALKPHOS 91 11/16/2011 1243   AST 19 11/16/2011 1243   ALT 18 11/16/2011 1243   BILITOT 0.60 11/16/2011 1243     ADDITIONAL INFORMATION: 1. A sample (Block 1B) was sent to [facility] for oncotype testing. The patient's recurrence score is 16. Those patients who had a recurrence score of 16 had an average rate of distant recurrence of 10%. (  Katherine Coleman:eps 12/20/11) Katherine Coleman Pathologist, Electronic Signature ( Signed 12/20/2011) 1. CHROMOGENIC IN-SITU HYBRIDIZATION Interpretation HER-2/NEU BY CISH - NO AMPLIFICATION OF HER-2 DETECTED. THE RATIO OF HER-2: CEP 17 SIGNALS WAS 1.18. Reference range: Ratio: HER2:CEP17 < 1.8 - gene amplification not observed Ratio: HER2:CEP 17 1.8-2.2 - equivocal result Ratio: HER2:CEP17 > 2.2 - gene amplification observed Katherine Coleman Pathologist, Electronic Signature ( Signed 12/02/2011) FINAL DIAGNOSIS Diagnosis 1. Breast, lumpectomy,  Left - INVASIVE GRADE II DUCTAL CARCINOMA, SPANNING 1.5 CM. - ASSOCIATED INTERMEDIATE GRADE DUCTAL CARCINOMA IN SITU. 1 of 4 FINAL for Katherine Coleman, Katherine Coleman ([id number]) Diagnosis(continued) - MEDIAL MARGIN IS BROADLY POSITIVE ON INITIAL LUMPECTOMY SPECIMEN. - TUMOR FOCALLY INVOLVES SUPERIOR MARGIN. - TUMOR IS <0.1 MM AWAY FROM ANTERIOR MARGIN. - OTHER MARGINS ARE NEGATIVE. - SEE ONCOLOGY TEMPLATE. 2. Breast, excision, Medial margin left - FIBROCYSTIC CHANGES WITH CALCIFICATION. - NO ATYPIA, HYPERPLASIA OR MALIGNANCY IDENTIFIED. 3. Lymph node, sentinel, biopsy, Left axillary - ONE LYMPH NODE, POSITIVE FOR MICROMETASTATIC DUCTAL CARCINOMA (1/1). - SEE COMMENT. Microscopic Comment 1. BREAST, INVASIVE TUMOR, WITH LYMPH NODE SAMPLING Specimen, including laterality: Left partial breast with additional left medial margin and sentinel lymph node. Procedure: Left breast lumpectomy with additional medial margin excision and sentinel lymph node biopsy. Grade: II. Tubule formation: 3. Nuclear pleomorphism: 2. Mitotic: 1. Tumor size (gross measurement): 1.5 cm. Margins: Invasive, distance to closest margin: Medial margin on initial lumpectomy specimen is broadly positive; superior margin is focally positive and anterior margin is extremely close <0.1 mm; Of note, the additional medial left margin received is negative for carcinoma -- please correlate with operative impression for completeness of resection of the medial aspect. In-situ, distance to closest margin: At least 0.2 cm. Lymphovascular invasion: Yes, present. Ductal carcinoma in situ: Yes. Grade: Intermediate grade. Extensive intraductal component: No. Lobular neoplasia: No. Tumor focality: Unifocal. Treatment effect: Not applicable. Extent of tumor: Tumor confined to breast parenchyma. Lymph nodes: # examined: 1. Lymph nodes with metastasis: 1. Micrometastasis: (> 0.2 mm and < 2.0 mm): 1. Breast prognostic profile: Performed on  previous case [id number]: Estrogen receptor: 100%, positive Progesterone receptor: 63%, positive Her 2 neu: 1.56, no amplification Ki-67: 62% Non-neoplastic breast: Incidental benign fibroadenoma, fibrocystic changes with calcifications. TNM: pT1c, pN46mi, MX. Comments: A cytokeratin AE1/AE3 is performed on a single block of tumor to assess the margin status. A Her-2 neu by CISH will be repeated on the current tumor and reported in an addendum. 3. A cytokeratin AE1/AE3 stain is performed which is positive and helps to highlight the extent of involvement. There is not a cluster of tumor cells present which is greater than 0.2 cm (2 mm). Therefore, the metastasis 2 of 4 FINAL for Katherine Coleman, Katherine Coleman ([id number]) Microscopic Comment(continued) is best characterized as a micrometastasis. Dr. Colonel Bald has seen the left axillary sentinel lymph node biopsy with a  RADIOGRAPHIC STUDIES:  No results found.  ASSESSMENT: 56 year old female with   1. Stage I IDC with 1 micromet to lymph node. S/p lumpectomy. Tumor ER positive, her2 negative. Margin was positive on final path.  2. Oncotype testing score in low risk category  3.   PLAN:  1. Proceed with re-excision   2. Proceed with RT after the re-excision  3. Will only receive anti-estrogen therapy with an aromatse inhibitors  4. Follow up with me in January 27, 2012   All questions were answered. The patient knows to call the clinic with any problems, questions or concerns. We can certainly see the patient  much sooner if necessary.  I spent 25 minutes counseling the patient face to face. The total time spent in the appointment was 30 minutes.    Katherine Second, Coleman Medical/Oncology [facility] [phone] (beeper) [phone] (Office)  12/30/2011, 3:21 PM

## 2012-01-03 ENCOUNTER — Encounter (HOSPITAL_COMMUNITY)
Admission: RE | Admit: 2012-01-03 | Discharge: 2012-01-03 | Disposition: A | Discharge status: Home / Self Care | Payer: Managed Care, Other (non HMO) | Source: Ambulatory Visit | Attending: Surgery | Admitting: Surgery

## 2012-01-03 ENCOUNTER — Encounter (HOSPITAL_COMMUNITY): Payer: Self-pay

## 2012-01-03 HISTORY — DX: Cardiac murmur, unspecified: R01.1

## 2012-01-03 HISTORY — DX: Other seasonal allergic rhinitis: J30.2

## 2012-01-03 LAB — CBC
HCT: 40.1 % (ref 36.0–46.0)
Hemoglobin: 13.6 g/dL (ref 12.0–15.0)
MCH: 27.6 pg (ref 26.0–34.0)
MCHC: 33.9 g/dL (ref 30.0–36.0)
RDW: 14.3 % (ref 11.5–15.5)

## 2012-01-03 LAB — SURGICAL PCR SCREEN: Staphylococcus aureus: NEGATIVE

## 2012-01-03 NOTE — Pre-Procedure Instructions (Addendum)
20 Katherine Coleman  01/03/2012   Your procedure is scheduled on:  Thursday, December 5th.  Report to [facility] at 10:00 AM.  Call this number if you have problems the morning of surgery: [phone]   Remember:                                  Nothing to eat or drink after Midnight.    Take these medicines the morning of surgery with A SIP OF WATER: Loratadine (Claritin).  Stop taking Aspirin, Coumadin, Plavix, Effient and herbal medications.  Do not take any NSAIDS ie:  Ibuprofen, Advil, Naproxen.   Do not wear jewelry, make-up or nail polish.  Do not wear lotions, powders, or perfumes. You may wear deodorant.  Do not shave 48 hours prior to surgery. Men may shave face and neck.  Do not bring valuables to the hospital.  Contacts, dentures or bridgework may not be worn into surgery.  Leave suitcase in the car. After surgery it may be brought to your room.  For patients admitted to the hospital, checkout time is 11:00 AM the day of discharge.   Patients discharged the day of surgery will not be allowed to drive home.  Name and phone number of your driver: -  Special Instructions: Shower using CHG 2 nights before surgery and the night before surgery.  If you shower the day of surgery use CHG.  Use special wash - you have one bottle of CHG for all showers.  You should use approximately 1/3 of the bottle for each shower.   Please read over the following fact sheets that you were given: Pain Booklet, Coughing and Deep Breathing and Surgical Site Infection Prevention

## 2012-01-11 MED ORDER — CIPROFLOXACIN IN D5W 400 MG/200ML IV SOLN
400.0000 mg | INTRAVENOUS | Status: AC
  Administered 2012-01-12: 400 mg via INTRAVENOUS
  Filled 2012-01-11: qty 200

## 2012-01-11 NOTE — H&P (Signed)
Katherine Coleman is an 56 y.o. female.   Chief Complaint: +margins HPI: 56 year old female presents for re-excision of left breast cancer secondary to positive margins.   She is without complaints.  Past Medical History  Diagnosis Date  . Fibroid   . Breast cancer   . No pertinent past medical history   . Seasonal allergies   . Heart murmur     "nothing to worry about"    Past Surgical History  Procedure Date  . Myomectomy   . Colonoscopy   . Breast surgery     Family History  Problem Relation Age of Onset  . Hypertension Mother   . Hypertension Father   . Prostate cancer Father   . Cancer Paternal Aunt     Colon cancer   Social History:  reports that she has never smoked. She does not have any smokeless tobacco history on file. She reports that she drinks alcohol. She reports that she does not use illicit drugs.  Allergies:  Allergies  Allergen Reactions  . Penicillins Nausea And Vomiting    No prescriptions prior to admission    No results found for this or any previous visit (from the past 48 hour(s)). No results found.  Review of Systems  All other systems reviewed and are negative.    There were no vitals taken for this visit. Physical Exam  Constitutional: She is oriented to person, place, and time. She appears well-developed and well-nourished. No distress.  Eyes: Conjunctivae normal are normal. Pupils are equal, round, and reactive to light.  Neck: Neck supple. No tracheal deviation present.  Cardiovascular: Normal rate, regular rhythm, normal heart sounds and intact distal pulses.   Respiratory: Effort normal and breath sounds normal. No respiratory distress. She has no wheezes.  GI: Soft. Bowel sounds are normal.  Neurological: She is alert and oriented to person, place, and time.  Skin: Skin is warm and dry. No rash noted. No erythema.  Psychiatric: Her behavior is normal. Judgment normal.     Assessment/Plan Left  Breast cancer with positive  margins s/p lumpectomy  Will proceed with re-excision of the left breast lumpectomy site to try and achieve neg margins.  The risks of bleeding, infection, need for further surgery, etc. Discussed.  Likelihood of success is good  Katherine Coleman A 01/11/2012, 7:24 PM

## 2012-01-11 NOTE — Progress Notes (Signed)
Spoke with patient and instructed patient to be here at 0530 for her surgery.

## 2012-01-12 ENCOUNTER — Ambulatory Visit (HOSPITAL_COMMUNITY)
Admission: RE | Admit: 2012-01-12 | Discharge: 2012-01-12 | Disposition: A | Discharge status: Home / Self Care | Payer: Managed Care, Other (non HMO) | Source: Ambulatory Visit | Attending: Surgery | Admitting: Surgery

## 2012-01-12 ENCOUNTER — Encounter (HOSPITAL_COMMUNITY)
Admission: RE | Disposition: A | Discharge status: Home / Self Care | Payer: Self-pay | Source: Ambulatory Visit | Attending: Surgery

## 2012-01-12 ENCOUNTER — Ambulatory Visit (HOSPITAL_COMMUNITY): Payer: Managed Care, Other (non HMO) | Admitting: Anesthesiology

## 2012-01-12 ENCOUNTER — Encounter (HOSPITAL_COMMUNITY): Payer: Self-pay | Admitting: Anesthesiology

## 2012-01-12 ENCOUNTER — Encounter (HOSPITAL_COMMUNITY): Payer: Self-pay | Admitting: *Deleted

## 2012-01-12 DIAGNOSIS — C50919 Malignant neoplasm of unspecified site of unspecified female breast: Secondary | ICD-10-CM

## 2012-01-12 DIAGNOSIS — Z01812 Encounter for preprocedural laboratory examination: Secondary | ICD-10-CM | POA: Insufficient documentation

## 2012-01-12 DIAGNOSIS — Z9889 Other specified postprocedural states: Secondary | ICD-10-CM

## 2012-01-12 HISTORY — DX: Other specified postprocedural states: Z98.890

## 2012-01-12 HISTORY — PX: RE-EXCISION OF BREAST CANCER,SUPERIOR MARGINS: SHX6047

## 2012-01-12 SURGERY — RE-EXCISION OF BREAST CANCER,SUPERIOR MARGINS
Anesthesia: General | Site: Breast | Laterality: Left | Wound class: Clean

## 2012-01-12 MED ORDER — OXYCODONE HCL 5 MG PO TABS: 5.0000 mg | ORAL_TABLET | Freq: Once | ORAL | Status: DC | PRN

## 2012-01-12 MED ORDER — FENTANYL CITRATE 0.05 MG/ML IJ SOLN
INTRAMUSCULAR | Status: DC | PRN
  Administered 2012-01-12: 100 ug via INTRAVENOUS

## 2012-01-12 MED ORDER — SUCCINYLCHOLINE CHLORIDE 20 MG/ML IJ SOLN
INTRAMUSCULAR | Status: DC | PRN
  Administered 2012-01-12: 100 mg via INTRAVENOUS

## 2012-01-12 MED ORDER — MIDAZOLAM HCL 5 MG/5ML IJ SOLN
INTRAMUSCULAR | Status: DC | PRN
  Administered 2012-01-12: 2 mg via INTRAVENOUS

## 2012-01-12 MED ORDER — HYDROCODONE-ACETAMINOPHEN 5-325 MG PO TABS: 1.0000 | ORAL_TABLET | ORAL | Status: DC | PRN

## 2012-01-12 MED ORDER — EPHEDRINE SULFATE 50 MG/ML IJ SOLN
INTRAMUSCULAR | Status: DC | PRN
  Administered 2012-01-12: 10 mg via INTRAVENOUS

## 2012-01-12 MED ORDER — 0.9 % SODIUM CHLORIDE (POUR BTL) OPTIME
TOPICAL | Status: DC | PRN
  Administered 2012-01-12: 1000 mL

## 2012-01-12 MED ORDER — MEPERIDINE HCL 25 MG/ML IJ SOLN: 6.2500 mg | INTRAMUSCULAR | Status: DC | PRN

## 2012-01-12 MED ORDER — PHENYLEPHRINE HCL 10 MG/ML IJ SOLN
INTRAMUSCULAR | Status: DC | PRN
  Administered 2012-01-12 (×5): 80 ug via INTRAVENOUS
  Administered 2012-01-12 (×2): 120 ug via INTRAVENOUS

## 2012-01-12 MED ORDER — OXYCODONE HCL 5 MG/5ML PO SOLN: 5.0000 mg | Freq: Once | ORAL | Status: DC | PRN

## 2012-01-12 MED ORDER — PROMETHAZINE HCL 25 MG/ML IJ SOLN: 6.2500 mg | INTRAMUSCULAR | Status: DC | PRN

## 2012-01-12 MED ORDER — BUPIVACAINE-EPINEPHRINE PF 0.25-1:200000 % IJ SOLN
INTRAMUSCULAR | Status: AC
  Filled 2012-01-12: qty 30

## 2012-01-12 MED ORDER — LACTATED RINGERS IV SOLN
INTRAVENOUS | Status: DC | PRN
  Administered 2012-01-12: 07:00:00 via INTRAVENOUS

## 2012-01-12 MED ORDER — MIDAZOLAM HCL 2 MG/2ML IJ SOLN: 0.5000 mg | Freq: Once | INTRAMUSCULAR | Status: DC | PRN

## 2012-01-12 MED ORDER — LIDOCAINE HCL (CARDIAC) 20 MG/ML IV SOLN
INTRAVENOUS | Status: DC | PRN
  Administered 2012-01-12: 20 mg via INTRAVENOUS

## 2012-01-12 MED ORDER — HYDROMORPHONE HCL PF 1 MG/ML IJ SOLN: 0.2500 mg | INTRAMUSCULAR | Status: DC | PRN

## 2012-01-12 MED ORDER — PROPOFOL 10 MG/ML IV BOLUS
INTRAVENOUS | Status: DC | PRN
  Administered 2012-01-12: 200 mg via INTRAVENOUS
  Administered 2012-01-12: 100 mg via INTRAVENOUS

## 2012-01-12 MED ORDER — ONDANSETRON HCL 4 MG/2ML IJ SOLN
INTRAMUSCULAR | Status: DC | PRN
  Administered 2012-01-12: 4 mg via INTRAVENOUS

## 2012-01-12 MED ORDER — BUPIVACAINE-EPINEPHRINE 0.25% -1:200000 IJ SOLN
INTRAMUSCULAR | Status: DC | PRN
  Administered 2012-01-12: 20 mL

## 2012-01-12 SURGICAL SUPPLY — 46 items
APL SKNCLS STERI-STRIP NONHPOA (GAUZE/BANDAGES/DRESSINGS) ×1
APPLIER CLIP 9.375 SM OPEN (CLIP) ×2
APR CLP SM 9.3 20 MLT OPN (CLIP) ×1
BENZOIN TINCTURE PRP APPL 2/3 (GAUZE/BANDAGES/DRESSINGS) ×2 IMPLANT
BLADE SURG 15 STRL LF DISP TIS (BLADE) ×1 IMPLANT
BLADE SURG 15 STRL SS (BLADE) ×2
CANISTER SUCTION 2500CC (MISCELLANEOUS) IMPLANT
CHLORAPREP W/TINT 26ML (MISCELLANEOUS) ×2 IMPLANT
CLIP APPLIE 9.375 SM OPEN (CLIP) IMPLANT
CLOTH BEACON ORANGE TIMEOUT ST (SAFETY) ×2 IMPLANT
CONT SPEC 4OZ CLIKSEAL STRL BL (MISCELLANEOUS) ×2 IMPLANT
COVER SURGICAL LIGHT HANDLE (MISCELLANEOUS) ×2 IMPLANT
DECANTER SPIKE VIAL GLASS SM (MISCELLANEOUS) ×1 IMPLANT
DRAPE CHEST BREAST 15X10 FENES (DRAPES) ×2 IMPLANT
DRSG TEGADERM 4X4.75 (GAUZE/BANDAGES/DRESSINGS) ×3 IMPLANT
ELECT CAUTERY BLADE 6.4 (BLADE) ×2 IMPLANT
ELECT REM PT RETURN 9FT ADLT (ELECTROSURGICAL) ×2
ELECTRODE REM PT RTRN 9FT ADLT (ELECTROSURGICAL) ×1 IMPLANT
GLOVE BIO SURGEON STRL SZ7 (GLOVE) ×1 IMPLANT
GLOVE BIO SURGEON STRL SZ7.5 (GLOVE) ×1 IMPLANT
GLOVE BIOGEL PI IND STRL 7.0 (GLOVE) IMPLANT
GLOVE BIOGEL PI INDICATOR 7.0 (GLOVE) ×1
GLOVE SURG SIGNA 7.5 PF LTX (GLOVE) ×2 IMPLANT
GOWN PREVENTION PLUS XLARGE (GOWN DISPOSABLE) ×2 IMPLANT
GOWN STRL NON-REIN LRG LVL3 (GOWN DISPOSABLE) ×2 IMPLANT
KIT BASIN OR (CUSTOM PROCEDURE TRAY) ×2 IMPLANT
KIT ROOM TURNOVER OR (KITS) ×2 IMPLANT
NDL HYPO 25GX1X1/2 BEV (NEEDLE) ×1 IMPLANT
NEEDLE HYPO 25GX1X1/2 BEV (NEEDLE) ×2 IMPLANT
NS IRRIG 1000ML POUR BTL (IV SOLUTION) ×2 IMPLANT
PACK SURGICAL SETUP 50X90 (CUSTOM PROCEDURE TRAY) ×2 IMPLANT
PAD ARMBOARD 7.5X6 YLW CONV (MISCELLANEOUS) ×2 IMPLANT
PENCIL BUTTON HOLSTER BLD 10FT (ELECTRODE) ×2 IMPLANT
SPONGE GAUZE 4X4 12PLY (GAUZE/BANDAGES/DRESSINGS) ×2 IMPLANT
SPONGE LAP 4X18 X RAY DECT (DISPOSABLE) ×2 IMPLANT
STRIP CLOSURE SKIN 1/2X4 (GAUZE/BANDAGES/DRESSINGS) ×2 IMPLANT
SUT MNCRL AB 4-0 PS2 18 (SUTURE) ×2 IMPLANT
SUT SILK 2 0 SH (SUTURE) ×1 IMPLANT
SUT VIC AB 3-0 SH 27 (SUTURE) ×2
SUT VIC AB 3-0 SH 27X BRD (SUTURE) ×1 IMPLANT
SYR BULB 3OZ (MISCELLANEOUS) ×2 IMPLANT
SYR CONTROL 10ML LL (SYRINGE) ×2 IMPLANT
TOWEL OR 17X24 6PK STRL BLUE (TOWEL DISPOSABLE) ×2 IMPLANT
TOWEL OR 17X26 10 PK STRL BLUE (TOWEL DISPOSABLE) ×2 IMPLANT
TUBE CONNECTING 12X1/4 (SUCTIONS) IMPLANT
YANKAUER SUCT BULB TIP NO VENT (SUCTIONS) IMPLANT

## 2012-01-12 NOTE — Op Note (Signed)
[name], Katherine Coleman                 ACCOUNT NO.:  [id number]  MEDICAL RECORD NO.:  [id number]  LOCATION:  [facility]                         FACILITY:  [facility]  PHYSICIAN:  Abigail Miyamoto, M.D. DATE OF BIRTH:  23-Aug-1955  DATE OF PROCEDURE:  01/12/2012 DATE OF DISCHARGE:  01/12/2012                              OPERATIVE REPORT   PREOPERATIVE DIAGNOSIS:  Left breast cancer.  POSTOPERATIVE DIAGNOSIS:  Left breast cancer.  PROCEDURE:  Re-excision of left breast cancer.  SURGEON:  Abigail Miyamoto, MD  ANESTHESIA:  General and 0.5% Marcaine.  ESTIMATED BLOOD LOSS:  Minimal.  INDICATIONS:  This is a 56 year old female who had undergone a previous left breast lumpectomy and sentinel node biopsy.  She still had a focally positive superior margin and closed anterior margin.  So decision was made to proceed with reexcision.  PROCEDURE IN DETAIL:  The patient was brought to the operating room, identified as Rip Harbour.  She was placed supine on the operating room table and general anesthesia was induced.  Her left breast was then prepped and draped in the usual sterile fashion.  I performed elliptical incision around the previous scar with a scalpel.  I took this down to the breast tissue with electrocautery.  I then performed a wide excision of the entire lumpectomy specimen including a wide anterior/medial/superior margin.  The entire specimen was then completely removed with electrocautery.  I went all the way down to the chest wall.  I marked the superior margin with a silk suture as well as the lateral margin with a silk suture.  The specimen was sent to Pathology for evaluation.  I placed surgical clips at the borders of the biopsy cavity.  I anesthetized the wound with Marcaine.  Hemostasis appeared to be achieved with cautery.  I closed the subcutaneous tissue with interrupted 3-0 Vicryl sutures and closed the skin with a running 4- 0 Monocryl.  Steri-Strips, gauze, and Tegaderm  were then applied.  The patient tolerated the procedure well.  All of the counts were correct at the end of procedure.  The patient was then extubated in the operating room and taken in a stable condition to the recovery room.     Abigail Miyamoto, M.D.    DB/MEDQ  D:  01/12/2012  T:  01/12/2012  Job:  [id number]

## 2012-01-12 NOTE — Preoperative (Signed)
Beta Blockers   Reason not to administer Beta Blockers:Not Applicable 

## 2012-01-12 NOTE — Op Note (Signed)
RE-EXCISION OF BREAST CANCER,SUPERIOR MARGINS  Procedure Note  Katherine Coleman 01/12/2012   Pre-op Diagnosis: left breast cancer     Post-op Diagnosis: same  Procedure(s): RE-EXCISION OF BREAST CANCER  Surgeon(s): Shelly Rubenstein, MD  Anesthesia: General  Staff:  Evelena Peat, RN - Circulator Netta Corrigan, RN - Scrub Person Isidoro Donning, RN - Circulator Assistant  Estimated Blood Loss: Minimal               Specimens: sent to path          [facility]   Date: 01/12/2012  Time: 8:22 AM

## 2012-01-12 NOTE — Anesthesia Postprocedure Evaluation (Signed)
  Anesthesia Post-op Note  Patient: Katherine Coleman  Procedure(s) Performed: Procedure(s) (LRB) with comments: RE-EXCISION OF BREAST CANCER,SUPERIOR MARGINS (Left) - re-excision left breast cancer   Patient Location: PACU  Anesthesia Type:General  Level of Consciousness: awake, alert , oriented and patient cooperative  Airway and Oxygen Therapy: Patient Spontanous Breathing  Post-op Pain: none  Post-op Assessment: Post-op Vital signs reviewed, Patient's Cardiovascular Status Stable, Respiratory Function Stable, Patent Airway, No signs of Nausea or vomiting and Pain level controlled  Post-op Vital Signs: Reviewed and stable  Complications: No apparent anesthesia complications

## 2012-01-12 NOTE — Transfer of Care (Signed)
Immediate Anesthesia Transfer of Care Note  Patient: Katherine Coleman  Procedure(s) Performed: Procedure(s) (LRB) with comments: RE-EXCISION OF BREAST CANCER,SUPERIOR MARGINS (Left) - re-excision left breast cancer   Patient Location: PACU  Anesthesia Type:General  Level of Consciousness: awake, alert  and oriented  Airway & Oxygen Therapy: Patient Spontanous Breathing and Patient connected to nasal cannula oxygen  Post-op Assessment: Report given to PACU RN, Post -op Vital signs reviewed and stable and Patient moving all extremities X 4  Post vital signs: Reviewed and stable  Complications: No apparent anesthesia complications

## 2012-01-12 NOTE — Anesthesia Preprocedure Evaluation (Addendum)
Anesthesia Evaluation  Patient identified by MRN, date of birth, ID band Patient awake    Reviewed: Allergy & Precautions, H&P , NPO status , Patient's Chart, lab work & pertinent test results  History of Anesthesia Complications Negative for: history of anesthetic complications  Airway Mallampati: I TM Distance: >3 FB Neck ROM: Full    Dental  (+) Teeth Intact and Dental Advisory Given   Pulmonary neg pulmonary ROS,  breath sounds clear to auscultation  Pulmonary exam normal       Cardiovascular + Valvular Problems/Murmurs (h/o murmur with normal ECHO in past) Rhythm:Regular Rate:Normal + Systolic murmurs (II/VI)    Neuro/Psych negative neurological ROS     GI/Hepatic negative GI ROS, Neg liver ROS,   Endo/Other  negative endocrine ROS  Renal/GU negative Renal ROS     Musculoskeletal   Abdominal   Peds  Hematology negative hematology ROS (+)   Anesthesia Other Findings   Reproductive/Obstetrics                           Anesthesia Physical Anesthesia Plan  ASA: I  Anesthesia Plan: General   Post-op Pain Management:    Induction: Intravenous  Airway Management Planned: LMA  Additional Equipment:   Intra-op Plan:   Post-operative Plan:   Informed Consent: I have reviewed the patients History and Physical, chart, labs and discussed the procedure including the risks, benefits and alternatives for the proposed anesthesia with the patient or authorized representative who has indicated his/her understanding and acceptance.   Dental advisory given  Plan Discussed with: CRNA and Surgeon  Anesthesia Plan Comments: (Plan routine monitors, GA- LMA OK)        Anesthesia Quick Evaluation

## 2012-01-12 NOTE — Interval H&P Note (Signed)
History and Physical Interval Note:  No change  01/12/2012 7:12 AM  Katherine Coleman  has presented today for surgery, with the diagnosis of left breast cancer  The various methods of treatment have been discussed with the patient and family. After consideration of risks, benefits and other options for treatment, the patient has consented to  Procedure(s) (LRB) with comments: RE-EXCISION OF BREAST CANCER,SUPERIOR MARGINS (Left) - re-excision left breast cancer  as a surgical intervention .  The patient's history has been reviewed, patient examined, no change in status, stable for surgery.  I have reviewed the patient's chart and labs.  Questions were answered to the patient's satisfaction.     Anabelle Bungert A

## 2012-01-12 NOTE — Anesthesia Procedure Notes (Addendum)
Procedure Name: Intubation Date/Time: 01/12/2012 7:47 AM Performed by: Quentin Ore Pre-anesthesia Checklist: Patient identified, Emergency Drugs available, Suction available, Patient being monitored and Timeout performed Patient Re-evaluated:Patient Re-evaluated prior to inductionOxygen Delivery Method: Circle system utilized Preoxygenation: Pre-oxygenation with 100% oxygen Intubation Type: IV induction and Rapid sequence Ventilation: Mask ventilation without difficulty Laryngoscope Size: Mac and 3 Grade View: Grade II Tube type: Oral Tube size: 7.5 mm Number of attempts: 1 Airway Equipment and Method: Stylet Placement Confirmation: ETT inserted through vocal cords under direct vision,  positive ETCO2 and breath sounds checked- equal and bilateral Secured at: 22 cm Tube secured with: Tape Dental Injury: Teeth and Oropharynx as per pre-operative assessment  Comments: Attempted LMA 4 insertion x 2 per MDA. Attempted LMA supreme x1 per CRNA. Unable to sit properly. AOl with 7.5 ETT per CRNA.

## 2012-01-13 ENCOUNTER — Encounter: Payer: Self-pay | Admitting: *Deleted

## 2012-01-16 ENCOUNTER — Ambulatory Visit
Admission: RE | Admit: 2012-01-16 | Discharge: 2012-01-16 | Disposition: A | Discharge status: Home / Self Care | Payer: Managed Care, Other (non HMO) | Source: Ambulatory Visit | Attending: Radiation Oncology | Admitting: Radiation Oncology

## 2012-01-16 ENCOUNTER — Encounter: Payer: Self-pay | Admitting: Radiation Oncology

## 2012-01-16 VITALS — BP 150/88 | HR 88 | Temp 98.7°F | Resp 20 | Wt 177.0 lb

## 2012-01-16 DIAGNOSIS — R011 Cardiac murmur, unspecified: Secondary | ICD-10-CM | POA: Insufficient documentation

## 2012-01-16 DIAGNOSIS — Z9889 Other specified postprocedural states: Secondary | ICD-10-CM | POA: Insufficient documentation

## 2012-01-16 DIAGNOSIS — J302 Other seasonal allergic rhinitis: Secondary | ICD-10-CM | POA: Insufficient documentation

## 2012-01-16 DIAGNOSIS — C50419 Malignant neoplasm of upper-outer quadrant of unspecified female breast: Secondary | ICD-10-CM

## 2012-01-16 DIAGNOSIS — C50919 Malignant neoplasm of unspecified site of unspecified female breast: Secondary | ICD-10-CM | POA: Insufficient documentation

## 2012-01-16 HISTORY — DX: Other specified postprocedural states: Z98.890

## 2012-01-16 NOTE — Progress Notes (Signed)
Please see the Nurse Progress Note in the MD Initial Consult Encounter for this patient. 

## 2012-01-16 NOTE — Progress Notes (Signed)
Radiation Oncology         [phone] ________________________________  Name: Katherine Coleman MRN: [id number]  Date: 01/16/2012  DOB: Jul 31, 1955  Follow-Up Visit Note  CC: Provider Not In System  Victorino December, MD  Diagnosis:   Invasive ductal carcinoma of the left breast status post lumpectomy, T1 C. N1 MI M0  Interval Since Last Radiation:  Not applicable   Narrative:  The patient returns today for routine follow-up.  The patient was initially seen in multidisciplinary clinic. Since that time she has elected to proceed with breast conservation treatment and underwent a lumpectomy on 11/25/2011. This revealed an invasive grade 2 ductal carcinoma measuring 1.5 cm. There was associated DCIS. The medial margin was broadly positive in the anterior margin was also close at less than 0.1 mm. The tumor also focally involved the superior margin. An additional medial margin was negative for malignancy. One sentinel lymph node was evaluated and this returned positive for micrometastatic ductal carcinoma. Lymphovascular space invasion is seen. Tumor receptors have indicated that the tumor is ER positive and PR positive as well as HER-2/neu negative.   The patient did proceed to undergo a reexcision on 01/12/2012. This did not reveal any malignancy.                      The patient had an Oncotype test performed which put her in a low risk category. The decision has been made for her to proceed with anti-hormonal treatment alone after adjuvant radiotherapy.  Patient states that she is healing recently well from her surgery although this was completed just several days ago.          ALLERGIES:  is allergic to penicillins.  Meds: Current Outpatient Prescriptions  Medication Sig Dispense Refill  . HYDROcodone-acetaminophen (NORCO) 5-325 MG per tablet Take 1-2 tablets by mouth every 4 (four) hours as needed for pain (pt has med already).  1 tablet  0  . loratadine (CLARITIN) 10 MG tablet Take 10 mg by  mouth daily.      . Multiple Vitamin (MULTIVITAMIN) tablet Take 1 tablet by mouth daily.          Physical Findings: The patient is in no acute distress. Patient is alert and oriented.  weight is 177 lb (80.287 kg). Her oral temperature is 98.7 F (37.1 C). Her blood pressure is 150/88 and her pulse is 88. Her respiration is 20. .     Lab Findings: Lab Results  Component Value Date   WBC 5.8 01/03/2012   HGB 13.6 01/03/2012   HCT 40.1 01/03/2012   MCV 81.5 01/03/2012   PLT 257 01/03/2012     Radiographic Findings: No results found.  Impression:    The patient is status post a lumpectomy for a T1 CN1MI N0 invasive ductal carcinoma of the left breast. Patient is appropriate to proceed with adjuvant radiotherapy at this time.  Plan:  I discussed with the patient a typical 6-1/2 week course of treatment. She had a number of questions today as did her husband. We discussed the expected benefit of such a treatment as well as the potential side effects and risks. All of their questions were answered. She does wish to proceed with radiotherapy and therefore she will be scheduled for a simulation in several weeks such that we can proceed with treatment planning.  The patient has an upper outer quadrant tumor. Given the micrometastatic disease and given her young age, it may be reasonable  to consider high tangents for her although this likely would not be substantially different potentially from standard fields given her location. I will look at this further after simulation.  I spent 25 minutes with the patient today, the majority of which was spent counseling the patient on the diagnosis of cancer and coordinating care.   Radene Gunning, M.D., Ph.D.

## 2012-01-16 NOTE — Progress Notes (Signed)
Pt states she has some post-op discomfort left breast from re-excision on 01/12/12. She has Hydrocodone prn but has not taken in past few days. She denies fatigue, loss of appetite. Here w/spouse; pt works.

## 2012-01-17 NOTE — Addendum Note (Signed)
Encounter addended by: Zoejane Gaulin Clare Yaffa Seckman, RN on: 01/17/2012  8:16 AM<BR>     Documentation filed: Charges VN

## 2012-01-18 NOTE — Addendum Note (Signed)
Encounter addended by: Jaloni Davoli Mintz Lorali Khamis, RN on: 01/18/2012  7:59 PM<BR>     Documentation filed: Charges VN

## 2012-01-23 ENCOUNTER — Encounter: Payer: Self-pay | Admitting: *Deleted

## 2012-01-27 ENCOUNTER — Ambulatory Visit (INDEPENDENT_AMBULATORY_CARE_PROVIDER_SITE_OTHER): Payer: Managed Care, Other (non HMO) | Admitting: Surgery

## 2012-01-27 ENCOUNTER — Ambulatory Visit (HOSPITAL_BASED_OUTPATIENT_CLINIC_OR_DEPARTMENT_OTHER): Payer: Managed Care, Other (non HMO) | Admitting: Oncology

## 2012-01-27 ENCOUNTER — Other Ambulatory Visit (HOSPITAL_BASED_OUTPATIENT_CLINIC_OR_DEPARTMENT_OTHER): Payer: Managed Care, Other (non HMO) | Admitting: Lab

## 2012-01-27 ENCOUNTER — Encounter: Payer: Self-pay | Admitting: Oncology

## 2012-01-27 ENCOUNTER — Telehealth: Payer: Self-pay | Admitting: *Deleted

## 2012-01-27 ENCOUNTER — Encounter (INDEPENDENT_AMBULATORY_CARE_PROVIDER_SITE_OTHER): Payer: Self-pay | Admitting: Surgery

## 2012-01-27 VITALS — BP 176/108 | HR 101 | Temp 98.4°F | Resp 20 | Ht 67.0 in | Wt 178.8 lb

## 2012-01-27 VITALS — BP 140/98 | HR 72 | Temp 98.5°F | Resp 18 | Wt 177.4 lb

## 2012-01-27 DIAGNOSIS — Z170 Estrogen receptor positive status [ER+]: Secondary | ICD-10-CM

## 2012-01-27 DIAGNOSIS — Z09 Encounter for follow-up examination after completed treatment for conditions other than malignant neoplasm: Secondary | ICD-10-CM

## 2012-01-27 DIAGNOSIS — C50419 Malignant neoplasm of upper-outer quadrant of unspecified female breast: Secondary | ICD-10-CM

## 2012-01-27 DIAGNOSIS — C773 Secondary and unspecified malignant neoplasm of axilla and upper limb lymph nodes: Secondary | ICD-10-CM

## 2012-01-27 LAB — CBC WITH DIFFERENTIAL/PLATELET
BASO%: 0.4 % (ref 0.0–2.0)
Basophils Absolute: 0 10*3/uL (ref 0.0–0.1)
EOS%: 1.5 % (ref 0.0–7.0)
HGB: 13 g/dL (ref 11.6–15.9)
MCH: 27.8 pg (ref 25.1–34.0)
MCHC: 33.4 g/dL (ref 31.5–36.0)
RDW: 14.4 % (ref 11.2–14.5)
lymph#: 1.8 10*3/uL (ref 0.9–3.3)

## 2012-01-27 LAB — COMPREHENSIVE METABOLIC PANEL (CC13)
ALT: 25 U/L (ref 0–55)
AST: 22 U/L (ref 5–34)
Albumin: 3.9 g/dL (ref 3.5–5.0)
Calcium: 9.5 mg/dL (ref 8.4–10.4)
Chloride: 104 mEq/L (ref 98–107)
Potassium: 3.8 mEq/L (ref 3.5–5.1)

## 2012-01-27 MED ORDER — DOXYCYCLINE HYCLATE 100 MG PO TABS: 100.0000 mg | ORAL_TABLET | Freq: Two times a day (BID) | ORAL | Status: DC

## 2012-01-27 NOTE — Progress Notes (Signed)
OFFICE PROGRESS NOTE  CC Dr. Carman Coleman Dr. Dorothy Coleman Dr. Carmelina Coleman  DIAGNOSIS: 56 year old female with stage II left invasive breast cancer  PRIOR THERAPY: 1. Patient was seen at the [facility] for new diagnosis of breast cancer. She had a 1.8 cm mass in the left breast at the 1:30 position biopsy was positive for invasive ductal carcinoma ER/PR+ Her2 negative .  2. She is now s/p lumpectomy with SNL on 10/18 which revealed a 1.5 cm grade II IDC with DCIS, ER/PR positive Her2Neu negative with Ki-67 at 62% 1 sentinel node positive for micromets.  3. Oncotype Dx testing reveals a recurrence score of 16 with 5 year distance recurrence of 10% with Tamoxifen. Putting her in the low risk category.  CURRENT THERAPY:  Radiation therapy   INTERVAL HISTORY: Katherine Coleman 56 y.o. female returns for followup after her surgery. She tolerated the surgery well. She is without any complaints. Overall she is doing well. No c/o of N/V/D no fevers or chills, no headaches. No pain. Remainder of the review of systems is negative.  MEDICAL HISTORY: Past Medical History  Diagnosis Date  . Fibroid   . No pertinent past medical history   . Seasonal allergies   . Breast cancer 11/07/11    left breast 1 o'clock bx=invasive ductal ca,lymphovascular invasion  ER/PR=positive,her 2 neu=neg  . Heart murmur     "nothing to worry about", childhood  . History of breast lump/mass excision 01/12/12    re-excision left breast    ALLERGIES:  is allergic to penicillins.  MEDICATIONS:  Current Outpatient Prescriptions  Medication Sig Dispense Refill  . HYDROcodone-acetaminophen (NORCO) 5-325 MG per tablet Take 1-2 tablets by mouth every 4 (four) hours as needed for pain (pt has med already).  1 tablet  0  . loratadine (CLARITIN) 10 MG tablet Take 10 mg by mouth daily.      . Multiple Vitamin (MULTIVITAMIN) tablet Take 1 tablet by mouth daily.          SURGICAL HISTORY:  Past Surgical History  Procedure  Date  . Myomectomy   . Colonoscopy   . Breast surgery 11/25/11    Left Lumpectomy/lymph node,sentinel bx,l axillary=+ metastatic ductal ca  . Re-excision of breast cancer,superior margins 01/12/2012    Procedure: RE-EXCISION OF BREAST CANCER,SUPERIOR MARGINS;  Surgeon: Katherine Rubenstein, MD;  Location: [facility] OR;  Service: General;  Laterality: Left;  re-excision left breast cancer   . Re-excision of breast cancer,superior margins 01/12/12    left    REVIEW OF SYSTEMS:  A comprehensive review of systems was negative.   HEALTH MAINTENANCE:  PHYSICAL EXAMINATION: Blood pressure 176/108, pulse 101, temperature 98.4 F (36.9 C), temperature source Oral, resp. rate 20, height 5\' 7"  (1.702 m), weight 178 lb 12.8 oz (81.103 kg). Body mass index is 28.00 kg/(m^2). ECOG PERFORMANCE STATUS: 0 - Asymptomatic   General appearance: alert, cooperative and appears stated age Neck: no adenopathy, no carotid bruit, no JVD, supple, symmetrical, trachea midline and thyroid not enlarged, symmetric, no tenderness/mass/nodules Lymph nodes: Cervical, supraclavicular, and axillary nodes normal. Resp: clear to auscultation bilaterally Back: symmetric, no curvature. ROM normal. No CVA tenderness. Cardio: regular rate and rhythm GI: soft, non-tender; bowel sounds normal; no masses,  no organomegaly Extremities: extremities normal, atraumatic, no cyanosis or edema Neurologic: Grossly normal   LABORATORY DATA: Lab Results  Component Value Date   WBC 5.1 01/27/2012   HGB 13.0 01/27/2012   HCT 38.9 01/27/2012   MCV  83.3 01/27/2012   PLT 226 01/27/2012      Chemistry      Component Value Date/Time   NA 141 11/16/2011 1243   K 4.0 11/16/2011 1243   CL 105 11/16/2011 1243   CO2 24 11/16/2011 1243   BUN 11.0 11/16/2011 1243   CREATININE 0.9 11/16/2011 1243      Component Value Date/Time   CALCIUM 9.9 11/16/2011 1243   ALKPHOS 91 11/16/2011 1243   AST 19 11/16/2011 1243   ALT 18 11/16/2011 1243   BILITOT 0.60  11/16/2011 1243     ADDITIONAL INFORMATION: 1. A sample (Block 1B) was sent to [facility] for oncotype testing. The patient's recurrence score is 16. Those patients who had a recurrence score of 16 had an average rate of distant recurrence of 10%. (JBK:eps 12/20/11) Katherine Leisure MD Pathologist, Electronic Signature ( Signed 12/20/2011) 1. CHROMOGENIC IN-SITU HYBRIDIZATION Interpretation HER-2/NEU BY CISH - NO AMPLIFICATION OF HER-2 DETECTED. THE RATIO OF HER-2: CEP 17 SIGNALS WAS 1.18. Reference range: Ratio: HER2:CEP17 < 1.8 - gene amplification not observed Ratio: HER2:CEP 17 1.8-2.2 - equivocal result Ratio: HER2:CEP17 > 2.2 - gene amplification observed Katherine Picket MD Pathologist, Electronic Signature ( Signed 12/02/2011) FINAL DIAGNOSIS Diagnosis 1. Breast, lumpectomy, Left - INVASIVE GRADE II DUCTAL CARCINOMA, SPANNING 1.5 CM. - ASSOCIATED INTERMEDIATE GRADE DUCTAL CARCINOMA IN SITU. 1 of 4 FINAL for Katherine Coleman, Katherine Coleman ([id number]) Diagnosis(continued) - MEDIAL MARGIN IS BROADLY POSITIVE ON INITIAL LUMPECTOMY SPECIMEN. - TUMOR FOCALLY INVOLVES SUPERIOR MARGIN. - TUMOR IS <0.1 MM AWAY FROM ANTERIOR MARGIN. - OTHER MARGINS ARE NEGATIVE. - SEE ONCOLOGY TEMPLATE. 2. Breast, excision, Medial margin left - FIBROCYSTIC CHANGES WITH CALCIFICATION. - NO ATYPIA, HYPERPLASIA OR MALIGNANCY IDENTIFIED. 3. Lymph node, sentinel, biopsy, Left axillary - ONE LYMPH NODE, POSITIVE FOR MICROMETASTATIC DUCTAL CARCINOMA (1/1). - SEE COMMENT. Microscopic Comment 1. BREAST, INVASIVE TUMOR, WITH LYMPH NODE SAMPLING Specimen, including laterality: Left partial breast with additional left medial margin and sentinel lymph node. Procedure: Left breast lumpectomy with additional medial margin excision and sentinel lymph node biopsy. Grade: II. Tubule formation: 3. Nuclear pleomorphism: 2. Mitotic: 1. Tumor size (gross measurement): 1.5 cm. Margins: Invasive, distance to closest margin:  Medial margin on initial lumpectomy specimen is broadly positive; superior margin is focally positive and anterior margin is extremely close <0.1 mm; Of note, the additional medial left margin received is negative for carcinoma -- please correlate with operative impression for completeness of resection of the medial aspect. In-situ, distance to closest margin: At least 0.2 cm. Lymphovascular invasion: Yes, present. Ductal carcinoma in situ: Yes. Grade: Intermediate grade. Extensive intraductal component: No. Lobular neoplasia: No. Tumor focality: Unifocal. Treatment effect: Not applicable. Extent of tumor: Tumor confined to breast parenchyma. Lymph nodes: # examined: 1. Lymph nodes with metastasis: 1. Micrometastasis: (> 0.2 mm and < 2.0 mm): 1. Breast prognostic profile: Performed on previous case [id number]: Estrogen receptor: 100%, positive Progesterone receptor: 63%, positive Her 2 neu: 1.56, no amplification Ki-67: 62% Non-neoplastic breast: Incidental benign fibroadenoma, fibrocystic changes with calcifications. TNM: pT1c, pN11mi, MX. Comments: A cytokeratin AE1/AE3 is performed on a single block of tumor to assess the margin status. A Her-2 neu by CISH will be repeated on the current tumor and reported in an addendum. 3. A cytokeratin AE1/AE3 stain is performed which is positive and helps to highlight the extent of involvement. There is not a cluster of tumor cells present which is greater than 0.2 cm (2 mm). Therefore, the metastasis 2 of 4 FINAL for  Katherine Coleman, Katherine Coleman ([id number]) Microscopic Comment(continued) is best characterized as a micrometastasis. Dr. Colonel Bald has seen the left axillary sentinel lymph node biopsy with a  RADIOGRAPHIC STUDIES:  No results found.  ASSESSMENT: 56 year old female with   1. Stage I IDC with 1 micromet to lymph node. S/p lumpectomy. Tumor ER positive, her2 negative. Margin was positive on final path.  2. Oncotype testing score in low  risk category  #3 patient is receiving radiation therapy. Once she completes this she will begin an aromatase inhibitor such as Arimidex 1 mg daily.   PLAN:  #1 complete radiation therapy  #2 I will see her back after completion of radiation.    All questions were answered. The patient knows to call the clinic with any problems, questions or concerns. We can certainly see the patient much sooner if necessary.  I spent 15 minutes counseling the patient face to face. The total time spent in the appointment was 30 minutes.    Drue Second, MD Medical/[facility] [phone] (beeper) [phone] (Office)  01/27/2012, 12:12 PM

## 2012-01-27 NOTE — Patient Instructions (Addendum)
Proceed with radiation  I will see you back after completion of Radiation

## 2012-01-27 NOTE — Telephone Encounter (Signed)
Gave patient appointment for 04-2012  

## 2012-01-27 NOTE — Progress Notes (Signed)
Subjective:     Patient ID: Katherine Coleman, female   DOB: 07/10/1955, 56 y.o.   MRN: [id number]  HPI She has no complaints. She is doing well postop. She has noticed a little bit of redness but no fevers her pain  Review of Systems     Objective:   Physical Exam On exam, there is slight erythema of the breast this is not at the incision. The final pathology showed no residual malignancy in the resection area    Assessment:     Stable postop    Plan:     I am going to go ahead and start her on doxycycline. I will see her back in one month

## 2012-01-31 ENCOUNTER — Ambulatory Visit
Admission: RE | Admit: 2012-01-31 | Discharge: 2012-01-31 | Disposition: A | Discharge status: Home / Self Care | Payer: Managed Care, Other (non HMO) | Source: Ambulatory Visit | Attending: Radiation Oncology | Admitting: Radiation Oncology

## 2012-01-31 DIAGNOSIS — C50419 Malignant neoplasm of upper-outer quadrant of unspecified female breast: Secondary | ICD-10-CM

## 2012-01-31 DIAGNOSIS — R21 Rash and other nonspecific skin eruption: Secondary | ICD-10-CM | POA: Insufficient documentation

## 2012-01-31 DIAGNOSIS — C50919 Malignant neoplasm of unspecified site of unspecified female breast: Secondary | ICD-10-CM | POA: Insufficient documentation

## 2012-01-31 DIAGNOSIS — Z510 Encounter for antineoplastic radiation therapy: Secondary | ICD-10-CM | POA: Insufficient documentation

## 2012-02-03 NOTE — Progress Notes (Signed)
  Radiation Oncology         [phone] ________________________________  Name: Katherine Coleman MRN: [id number]  Date: 01/31/2012  DOB: 04/02/1955   SIMULATION AND TREATMENT PLANNING NOTE  The patient presented for simulation prior to beginning her course of radiation treatment for her diagnosis of left-sided breast cancer. The patient was placed in a supine position on a breast board. A customized accuform device was also constructed, and this complex treatment device will be used on a daily basis during her treatment. In this fashion, a CT scan was obtained through the chest area and an isocenter was placed near the chest wall within the left breast. Both a free breathing scan and a breath-hold scan were both obtained to determine which technique was optimal, and to see if the breath-hold technique markedly improved the anticipated dose to the heart. This was the case. Therefore we will use this technique.  The patient will be planned to receive a course of radiation initially to a dose of 50.4 gray. This will consist of a whole breast radiotherapy technique. To accomplish this, 2 customized blocks have been designed which will correspond to medial and lateral whole breast tangent fields. This treatment will be accomplished at 1.8 gray per fraction. A complex isodose plan is requested to ensure that the breast target area is adequately covered dosimetrically. A forward planning technique will also be evaluated to determine if this approach improves the plan. It is anticipated that the patient will then receive a 10 gray boost to the seroma cavity which has been contoured. This will be accomplished at 2 gray per fraction. The final anticipated total dose therefore will correspond to 60.4 gray.   3D planning This initial treatment will consist of a 3-D conformal technique. The seroma has been contoured as the primary target structure. Additionally, dose volume histograms of both this target as well as  the lungs and heart will also be evaluated. Such an approach is necessary to ensure that the target area is adequately covered while the nearby critical normal structures are adequately spared.   _______________________________   Radene Gunning, MD, PhD

## 2012-02-09 ENCOUNTER — Encounter: Payer: Self-pay | Admitting: Radiation Oncology

## 2012-02-09 ENCOUNTER — Ambulatory Visit
Admission: RE | Admit: 2012-02-09 | Discharge: 2012-02-09 | Disposition: A | Discharge status: Home / Self Care | Payer: Managed Care, Other (non HMO) | Source: Ambulatory Visit | Attending: Radiation Oncology | Admitting: Radiation Oncology

## 2012-02-09 DIAGNOSIS — Z9889 Other specified postprocedural states: Secondary | ICD-10-CM

## 2012-02-09 NOTE — Progress Notes (Signed)
  Radiation Oncology         [phone] ________________________________  Name: Katherine Coleman MRN: [id number]  Date: 02/09/2012  DOB: July 24, 1955  Simulation Verification Note  Status: outpatient  NARRATIVE: The patient was brought to the treatment unit and placed in the planned treatment position. The clinical setup was verified. Then port films were obtained and uploaded to the radiation oncology medical record software.  The treatment beams were carefully compared against the planned radiation fields. The position location and shape of the radiation fields was reviewed. They targeted volume of tissue appears to be appropriately covered by the radiation beams. Organs at risk appear to be excluded as planned.  Based on my personal review, I approved the simulation verification. The patient's treatment will proceed as planned.  ------------------------------------------------  [name], M.D.

## 2012-02-13 ENCOUNTER — Ambulatory Visit: Payer: Managed Care, Other (non HMO)

## 2012-02-13 ENCOUNTER — Ambulatory Visit
Admission: RE | Admit: 2012-02-13 | Discharge: 2012-02-13 | Disposition: A | Discharge status: Home / Self Care | Payer: Managed Care, Other (non HMO) | Source: Ambulatory Visit | Attending: Radiation Oncology | Admitting: Radiation Oncology

## 2012-02-14 ENCOUNTER — Ambulatory Visit
Admission: RE | Admit: 2012-02-14 | Discharge: 2012-02-14 | Disposition: A | Discharge status: Home / Self Care | Payer: Managed Care, Other (non HMO) | Source: Ambulatory Visit | Attending: Radiation Oncology | Admitting: Radiation Oncology

## 2012-02-15 ENCOUNTER — Ambulatory Visit
Admission: RE | Admit: 2012-02-15 | Discharge: 2012-02-15 | Disposition: A | Discharge status: Home / Self Care | Payer: Managed Care, Other (non HMO) | Source: Ambulatory Visit | Attending: Radiation Oncology | Admitting: Radiation Oncology

## 2012-02-16 ENCOUNTER — Ambulatory Visit
Admission: RE | Admit: 2012-02-16 | Discharge: 2012-02-16 | Disposition: A | Discharge status: Home / Self Care | Payer: Managed Care, Other (non HMO) | Source: Ambulatory Visit | Attending: Radiation Oncology | Admitting: Radiation Oncology

## 2012-02-17 ENCOUNTER — Ambulatory Visit
Admission: RE | Admit: 2012-02-17 | Discharge: 2012-02-17 | Disposition: A | Discharge status: Home / Self Care | Payer: Managed Care, Other (non HMO) | Source: Ambulatory Visit | Attending: Radiation Oncology | Admitting: Radiation Oncology

## 2012-02-17 VITALS — BP 151/88 | HR 77 | Temp 98.8°F | Wt 178.1 lb

## 2012-02-17 DIAGNOSIS — C50419 Malignant neoplasm of upper-outer quadrant of unspecified female breast: Secondary | ICD-10-CM

## 2012-02-17 MED ORDER — ALRA NON-METALLIC DEODORANT (RAD-ONC)
1.0000 "application " | Freq: Once | TOPICAL | Status: AC
  Administered 2012-02-17: 1 via TOPICAL

## 2012-02-17 MED ORDER — RADIAPLEXRX EX GEL
Freq: Once | CUTANEOUS | Status: AC
  Administered 2012-02-17: 1 via TOPICAL

## 2012-02-17 NOTE — Progress Notes (Signed)
Routine of clinic reviewed,Doctor day generally every Friday and that he buddies with Dr.Manning but there is always an on-call doctor available.Reviewed skin care, side effects to include skin changes, pain and fatigue.Given RadiationTherapy and You Booklet and skin care sheet.

## 2012-02-17 NOTE — Progress Notes (Signed)
   Department of Radiation Oncology  Phone:  [phone] Fax:        [phone]  Weekly Treatment Note    Name: NIKELLE MALATESTA Date: 02/17/2012 MRN: [id number] DOB: Apr 04, 1955   Current dose: 9 Gy  Current fraction: 5   MEDICATIONS: Current Outpatient Prescriptions  Medication Sig Dispense Refill  . loratadine (CLARITIN) 10 MG tablet Take 10 mg by mouth daily.      . Multiple Vitamin (MULTIVITAMIN) tablet Take 1 tablet by mouth daily.           ALLERGIES: Penicillins   LABORATORY DATA:  Lab Results  Component Value Date   WBC 5.1 01/27/2012   HGB 13.0 01/27/2012   HCT 38.9 01/27/2012   MCV 83.3 01/27/2012   PLT 226 01/27/2012   Lab Results  Component Value Date   NA 141 01/27/2012   K 3.8 01/27/2012   CL 104 01/27/2012   CO2 30* 01/27/2012   Lab Results  Component Value Date   ALT 25 01/27/2012   AST 22 01/27/2012   ALKPHOS 108 01/27/2012   BILITOT 0.57 01/27/2012     NARRATIVE: MATTILYNN FORRER was seen today for weekly treatment management. The chart was checked and the patient's films were reviewed. The patient is doing very well at this time. No problems in her first week.  PHYSICAL EXAMINATION: weight is 178 lb 1.6 oz (80.786 kg). Her temperature is 98.8 F (37.1 C). Her blood pressure is 151/88 and her pulse is 77.        ASSESSMENT: The patient is doing satisfactorily with treatment.  PLAN: We will continue with the patient's radiation treatment as planned.

## 2012-02-20 ENCOUNTER — Ambulatory Visit
Admission: RE | Admit: 2012-02-20 | Discharge: 2012-02-20 | Disposition: A | Discharge status: Home / Self Care | Payer: Managed Care, Other (non HMO) | Source: Ambulatory Visit | Attending: Radiation Oncology | Admitting: Radiation Oncology

## 2012-02-21 ENCOUNTER — Ambulatory Visit
Admission: RE | Admit: 2012-02-21 | Discharge: 2012-02-21 | Disposition: A | Discharge status: Home / Self Care | Payer: Managed Care, Other (non HMO) | Source: Ambulatory Visit | Attending: Radiation Oncology | Admitting: Radiation Oncology

## 2012-02-22 ENCOUNTER — Ambulatory Visit
Admission: RE | Admit: 2012-02-22 | Discharge: 2012-02-22 | Disposition: A | Discharge status: Home / Self Care | Payer: Managed Care, Other (non HMO) | Source: Ambulatory Visit | Attending: Radiation Oncology | Admitting: Radiation Oncology

## 2012-02-22 VITALS — BP 179/76 | HR 80 | Temp 98.2°F | Wt 178.4 lb

## 2012-02-22 DIAGNOSIS — C50419 Malignant neoplasm of upper-outer quadrant of unspecified female breast: Secondary | ICD-10-CM

## 2012-02-22 NOTE — Progress Notes (Signed)
   Department of Radiation Oncology  Phone:  [phone] Fax:        [phone]  Weekly Treatment Note    Name: Katherine Coleman Date: 02/22/2012 MRN: [id number] DOB: 09/15/1955   Current dose: 14.4 Gy  Current fraction: 8   MEDICATIONS: Current Outpatient Prescriptions  Medication Sig Dispense Refill  . loratadine (CLARITIN) 10 MG tablet Take 10 mg by mouth daily.      . Multiple Vitamin (MULTIVITAMIN) tablet Take 1 tablet by mouth daily.           ALLERGIES: Penicillins   LABORATORY DATA:  Lab Results  Component Value Date   WBC 5.1 01/27/2012   HGB 13.0 01/27/2012   HCT 38.9 01/27/2012   MCV 83.3 01/27/2012   PLT 226 01/27/2012   Lab Results  Component Value Date   NA 141 01/27/2012   K 3.8 01/27/2012   CL 104 01/27/2012   CO2 30* 01/27/2012   Lab Results  Component Value Date   ALT 25 01/27/2012   AST 22 01/27/2012   ALKPHOS 108 01/27/2012   BILITOT 0.57 01/27/2012     NARRATIVE: Katherine Coleman was seen today for weekly treatment management. The chart was checked and the patient's films were reviewed. The patient is doing well overall with her treatment. Some mild changes in her scan per the patient.  PHYSICAL EXAMINATION: weight is 178 lb 6.4 oz (80.922 kg). Her temperature is 98.2 F (36.8 C). Her blood pressure is 179/76 and her pulse is 80.      some hyperpigmentation is emerging with very faint erythema. No desquamation.  ASSESSMENT: The patient is doing satisfactorily with treatment.  PLAN: We will continue with the patient's radiation treatment as planned.

## 2012-02-22 NOTE — Progress Notes (Signed)
Patient here for routine weekly radiation under treat assessment of left breast cancer radiation.Has completed 8 of 28 treatments.Has some mild redness of skin in areas otherwise doing well.No fatigue.

## 2012-02-23 ENCOUNTER — Ambulatory Visit
Admission: RE | Admit: 2012-02-23 | Discharge: 2012-02-23 | Disposition: A | Discharge status: Home / Self Care | Payer: Managed Care, Other (non HMO) | Source: Ambulatory Visit | Attending: Radiation Oncology | Admitting: Radiation Oncology

## 2012-02-24 ENCOUNTER — Ambulatory Visit
Admission: RE | Admit: 2012-02-24 | Discharge: 2012-02-24 | Disposition: A | Discharge status: Home / Self Care | Payer: Managed Care, Other (non HMO) | Source: Ambulatory Visit | Attending: Radiation Oncology | Admitting: Radiation Oncology

## 2012-02-27 ENCOUNTER — Ambulatory Visit (INDEPENDENT_AMBULATORY_CARE_PROVIDER_SITE_OTHER): Payer: Managed Care, Other (non HMO) | Admitting: Surgery

## 2012-02-27 ENCOUNTER — Encounter (INDEPENDENT_AMBULATORY_CARE_PROVIDER_SITE_OTHER): Payer: Self-pay | Admitting: Surgery

## 2012-02-27 ENCOUNTER — Ambulatory Visit
Admission: RE | Admit: 2012-02-27 | Discharge: 2012-02-27 | Disposition: A | Discharge status: Home / Self Care | Payer: Managed Care, Other (non HMO) | Source: Ambulatory Visit | Attending: Radiation Oncology | Admitting: Radiation Oncology

## 2012-02-27 VITALS — BP 145/85 | HR 70 | Temp 98.0°F | Resp 14 | Ht 67.0 in | Wt 176.0 lb

## 2012-02-27 DIAGNOSIS — Z09 Encounter for follow-up examination after completed treatment for conditions other than malignant neoplasm: Secondary | ICD-10-CM

## 2012-02-27 NOTE — Progress Notes (Signed)
Subjective:     Patient ID: Katherine Coleman, female   DOB: Sep 01, 1955, 57 y.o.   MRN: [id number]  HPI She is here for another postop visit status post left breast lumpectomy and then reexcision for margins for left breast cancer. She is currently undergoing radiation therapy. She is doing well and has no complaints. Review of Systems     Objective:   Physical Exam She has very mild skin changes from the radiation. Her breast is nontender and her incision is well-healed. There is no axillary adenopathy    Assessment:     Patient stable postop    Plan:     She will continue her current care plan with followup by the oncologist. I will see her back in 6 months

## 2012-02-28 ENCOUNTER — Ambulatory Visit
Admission: RE | Admit: 2012-02-28 | Discharge: 2012-02-28 | Disposition: A | Discharge status: Home / Self Care | Payer: Managed Care, Other (non HMO) | Source: Ambulatory Visit | Attending: Radiation Oncology | Admitting: Radiation Oncology

## 2012-02-29 ENCOUNTER — Ambulatory Visit
Admission: RE | Admit: 2012-02-29 | Discharge: 2012-02-29 | Disposition: A | Discharge status: Home / Self Care | Payer: Managed Care, Other (non HMO) | Source: Ambulatory Visit | Attending: Radiation Oncology | Admitting: Radiation Oncology

## 2012-03-01 ENCOUNTER — Ambulatory Visit
Admission: RE | Admit: 2012-03-01 | Discharge: 2012-03-01 | Disposition: A | Discharge status: Home / Self Care | Payer: Managed Care, Other (non HMO) | Source: Ambulatory Visit | Attending: Radiation Oncology | Admitting: Radiation Oncology

## 2012-03-02 ENCOUNTER — Ambulatory Visit
Admission: RE | Admit: 2012-03-02 | Discharge: 2012-03-02 | Disposition: A | Discharge status: Home / Self Care | Payer: Managed Care, Other (non HMO) | Source: Ambulatory Visit | Attending: Radiation Oncology | Admitting: Radiation Oncology

## 2012-03-02 VITALS — BP 141/79 | HR 88 | Temp 98.5°F | Wt 177.3 lb

## 2012-03-02 DIAGNOSIS — C50419 Malignant neoplasm of upper-outer quadrant of unspecified female breast: Secondary | ICD-10-CM

## 2012-03-02 NOTE — Progress Notes (Signed)
Weekly assessment of left breast cancer.Has some mild skin tanning.No peeling.Generalized fatigue.

## 2012-03-02 NOTE — Progress Notes (Signed)
   Department of Radiation Oncology  Phone:  [phone] Fax:        [phone]  Weekly Treatment Note    Name: Katherine Coleman Date: 03/02/2012 MRN: [id number] DOB: Jun 17, 1955   Current dose: 27 Gy  Current fraction: 15   MEDICATIONS: Current Outpatient Prescriptions  Medication Sig Dispense Refill  . loratadine (CLARITIN) 10 MG tablet Take 10 mg by mouth daily.      . Multiple Vitamin (MULTIVITAMIN) tablet Take 1 tablet by mouth daily.           ALLERGIES: Penicillins   LABORATORY DATA:  Lab Results  Component Value Date   WBC 5.1 01/27/2012   HGB 13.0 01/27/2012   HCT 38.9 01/27/2012   MCV 83.3 01/27/2012   PLT 226 01/27/2012   Lab Results  Component Value Date   NA 141 01/27/2012   K 3.8 01/27/2012   CL 104 01/27/2012   CO2 30* 01/27/2012   Lab Results  Component Value Date   ALT 25 01/27/2012   AST 22 01/27/2012   ALKPHOS 108 01/27/2012   BILITOT 0.57 01/27/2012     NARRATIVE: Katherine Coleman was seen today for weekly treatment management. The chart was checked and the patient's films were reviewed. The patient is doing well. No significant complaints today. She is just noticed a little bit of color change.  PHYSICAL EXAMINATION: weight is 177 lb 4.8 oz (80.423 kg). Her temperature is 98.5 F (36.9 C). Her blood pressure is 141/79 and her pulse is 88.      hyperpigmentation present, mild, no desquamation.  ASSESSMENT: The patient is doing satisfactorily with treatment.  PLAN: We will continue with the patient's radiation treatment as planned.

## 2012-03-05 ENCOUNTER — Ambulatory Visit
Admission: RE | Admit: 2012-03-05 | Discharge: 2012-03-05 | Disposition: A | Discharge status: Home / Self Care | Payer: Managed Care, Other (non HMO) | Source: Ambulatory Visit | Attending: Radiation Oncology | Admitting: Radiation Oncology

## 2012-03-06 ENCOUNTER — Ambulatory Visit: Payer: Managed Care, Other (non HMO)

## 2012-03-07 ENCOUNTER — Ambulatory Visit: Payer: Managed Care, Other (non HMO)

## 2012-03-08 ENCOUNTER — Ambulatory Visit
Admission: RE | Admit: 2012-03-08 | Discharge: 2012-03-08 | Disposition: A | Discharge status: Home / Self Care | Payer: Managed Care, Other (non HMO) | Source: Ambulatory Visit | Attending: Radiation Oncology | Admitting: Radiation Oncology

## 2012-03-09 ENCOUNTER — Ambulatory Visit
Admission: RE | Admit: 2012-03-09 | Discharge: 2012-03-09 | Disposition: A | Discharge status: Home / Self Care | Payer: Managed Care, Other (non HMO) | Source: Ambulatory Visit | Attending: Radiation Oncology | Admitting: Radiation Oncology

## 2012-03-09 VITALS — BP 133/68 | HR 96 | Temp 98.5°F | Wt 176.1 lb

## 2012-03-09 DIAGNOSIS — C50419 Malignant neoplasm of upper-outer quadrant of unspecified female breast: Secondary | ICD-10-CM

## 2012-03-09 MED ORDER — BIAFINE EX EMUL
CUTANEOUS | Status: DC | PRN
  Administered 2012-03-09: 1 via TOPICAL

## 2012-03-09 NOTE — Progress Notes (Signed)
   Department of Radiation Oncology  Phone:  [phone] Fax:        [phone]  Weekly Treatment Note    Name: Katherine Coleman Date: 03/09/2012 MRN: [id number] DOB: 02-21-1955   Current dose: 32.4 Gy  Current fraction: 18   MEDICATIONS: Current Outpatient Prescriptions  Medication Sig Dispense Refill  . loratadine (CLARITIN) 10 MG tablet Take 10 mg by mouth daily.      . Multiple Vitamin (MULTIVITAMIN) tablet Take 1 tablet by mouth daily.           ALLERGIES: Penicillins   LABORATORY DATA:  Lab Results  Component Value Date   WBC 5.1 01/27/2012   HGB 13.0 01/27/2012   HCT 38.9 01/27/2012   MCV 83.3 01/27/2012   PLT 226 01/27/2012   Lab Results  Component Value Date   NA 141 01/27/2012   K 3.8 01/27/2012   CL 104 01/27/2012   CO2 30* 01/27/2012   Lab Results  Component Value Date   ALT 25 01/27/2012   AST 22 01/27/2012   ALKPHOS 108 01/27/2012   BILITOT 0.57 01/27/2012     NARRATIVE: Katherine Coleman was seen today for weekly treatment management. The chart was checked and the patient's films were reviewed. The patient is experiencing some increased skin irritation with some itching. Otherwise doing well.  PHYSICAL EXAMINATION: weight is 176 lb 1.6 oz (79.878 kg). Her temperature is 98.5 F (36.9 C). Her blood pressure is 133/68 and her pulse is 96.      diffuse hyperpigmentation present with some underlying erythema  ASSESSMENT: The patient is doing satisfactorily with treatment.  PLAN: We will continue with the patient's radiation treatment as planned. She's been given some Biafine cream and will begin this today.

## 2012-03-09 NOTE — Addendum Note (Signed)
Encounter addended by: Tessa Lerner, RN on: 03/09/2012 12:25 PM<BR>     Documentation filed: Inpatient MAR

## 2012-03-09 NOTE — Addendum Note (Signed)
Encounter addended by: Tessa Lerner, RN on: 03/09/2012 12:16 PM<BR>     Documentation filed: Orders

## 2012-03-09 NOTE — Progress Notes (Signed)
Patient here for routine weekly assessment of left breast cancer radiation.Has completed 18 of 28 treatments.Has developed itching  follicular rash of left mammary fold.Will give biafine to try.Denies pain or fatigue.

## 2012-03-12 ENCOUNTER — Ambulatory Visit
Admission: RE | Admit: 2012-03-12 | Discharge: 2012-03-12 | Disposition: A | Discharge status: Home / Self Care | Payer: Managed Care, Other (non HMO) | Source: Ambulatory Visit | Attending: Radiation Oncology | Admitting: Radiation Oncology

## 2012-03-13 ENCOUNTER — Ambulatory Visit
Admission: RE | Admit: 2012-03-13 | Discharge: 2012-03-13 | Disposition: A | Discharge status: Home / Self Care | Payer: Managed Care, Other (non HMO) | Source: Ambulatory Visit | Attending: Radiation Oncology | Admitting: Radiation Oncology

## 2012-03-14 ENCOUNTER — Ambulatory Visit
Admission: RE | Admit: 2012-03-14 | Discharge: 2012-03-14 | Disposition: A | Discharge status: Home / Self Care | Payer: Managed Care, Other (non HMO) | Source: Ambulatory Visit | Attending: Radiation Oncology | Admitting: Radiation Oncology

## 2012-03-15 ENCOUNTER — Ambulatory Visit
Admission: RE | Admit: 2012-03-15 | Discharge: 2012-03-15 | Disposition: A | Discharge status: Home / Self Care | Payer: Managed Care, Other (non HMO) | Source: Ambulatory Visit | Attending: Radiation Oncology | Admitting: Radiation Oncology

## 2012-03-16 ENCOUNTER — Encounter: Payer: Self-pay | Admitting: Radiation Oncology

## 2012-03-16 ENCOUNTER — Ambulatory Visit
Admission: RE | Admit: 2012-03-16 | Discharge: 2012-03-16 | Disposition: A | Discharge status: Home / Self Care | Payer: Managed Care, Other (non HMO) | Source: Ambulatory Visit | Attending: Radiation Oncology | Admitting: Radiation Oncology

## 2012-03-16 VITALS — BP 136/84 | HR 85 | Temp 98.4°F | Wt 176.3 lb

## 2012-03-16 DIAGNOSIS — C50419 Malignant neoplasm of upper-outer quadrant of unspecified female breast: Secondary | ICD-10-CM

## 2012-03-16 NOTE — Progress Notes (Signed)
Patient here for weekly assessment of left breast radiation.Skin with significant hyperpigmentation a mild peel of left mammary fold.Biafine more effective in decreasing discomfort.Occassional fatigue curtailed with exercise and work.Has 5 more treatments remaining before starting boost.

## 2012-03-16 NOTE — Progress Notes (Signed)
   Department of Radiation Oncology  Phone:  [phone] Fax:        [phone]  Weekly Treatment Note    Name: Katherine Coleman Date: 03/16/2012 MRN: [id number] DOB: Jun 03, 1955   Current dose: 41.4 Gy  Current fraction: 23   MEDICATIONS: Current Outpatient Prescriptions  Medication Sig Dispense Refill  . loratadine (CLARITIN) 10 MG tablet Take 10 mg by mouth daily.      . Multiple Vitamin (MULTIVITAMIN) tablet Take 1 tablet by mouth daily.           ALLERGIES: Penicillins   LABORATORY DATA:  Lab Results  Component Value Date   WBC 5.1 01/27/2012   HGB 13.0 01/27/2012   HCT 38.9 01/27/2012   MCV 83.3 01/27/2012   PLT 226 01/27/2012   Lab Results  Component Value Date   NA 141 01/27/2012   K 3.8 01/27/2012   CL 104 01/27/2012   CO2 30* 01/27/2012   Lab Results  Component Value Date   ALT 25 01/27/2012   AST 22 01/27/2012   ALKPHOS 108 01/27/2012   BILITOT 0.57 01/27/2012     NARRATIVE: Katherine Coleman was seen today for weekly treatment management. The chart was checked and the patient's films were reviewed. The patient is doing very well. She is in good spirits. She does like the Biafine cream which has really been helping much better. She is using this twice a day.  PHYSICAL EXAMINATION: weight is 176 lb 4.8 oz (79.969 kg). Her temperature is 98.4 F (36.9 C). Her blood pressure is 136/84 and her pulse is 85.      some hyperpigmentation is present diffusely, overall the skin looks good without significant desquamation.  ASSESSMENT: The patient is doing satisfactorily with treatment.  PLAN: We will continue with the patient's radiation treatment as planned. We'll continue her current skin care.

## 2012-03-19 ENCOUNTER — Ambulatory Visit
Admission: RE | Admit: 2012-03-19 | Discharge: 2012-03-19 | Disposition: A | Discharge status: Home / Self Care | Payer: Managed Care, Other (non HMO) | Source: Ambulatory Visit | Attending: Radiation Oncology | Admitting: Radiation Oncology

## 2012-03-20 ENCOUNTER — Ambulatory Visit
Admission: RE | Admit: 2012-03-20 | Discharge: 2012-03-20 | Disposition: A | Discharge status: Home / Self Care | Payer: Managed Care, Other (non HMO) | Source: Ambulatory Visit | Attending: Radiation Oncology | Admitting: Radiation Oncology

## 2012-03-21 ENCOUNTER — Ambulatory Visit
Admission: RE | Admit: 2012-03-21 | Discharge: 2012-03-21 | Disposition: A | Discharge status: Home / Self Care | Payer: Managed Care, Other (non HMO) | Source: Ambulatory Visit | Attending: Radiation Oncology | Admitting: Radiation Oncology

## 2012-03-22 ENCOUNTER — Ambulatory Visit: Payer: Managed Care, Other (non HMO)

## 2012-03-23 ENCOUNTER — Ambulatory Visit
Admission: RE | Admit: 2012-03-23 | Discharge: 2012-03-23 | Disposition: A | Discharge status: Home / Self Care | Payer: Managed Care, Other (non HMO) | Source: Ambulatory Visit | Attending: Radiation Oncology | Admitting: Radiation Oncology

## 2012-03-23 ENCOUNTER — Encounter: Payer: Self-pay | Admitting: Radiation Oncology

## 2012-03-23 VITALS — BP 140/84 | HR 78 | Temp 97.5°F | Resp 18 | Wt 176.0 lb

## 2012-03-23 DIAGNOSIS — C50419 Malignant neoplasm of upper-outer quadrant of unspecified female breast: Secondary | ICD-10-CM

## 2012-03-23 NOTE — Progress Notes (Signed)
Patient presents to the clinic today unaccompanied for PUT with Dr. Mitzi Hansen. Patient alert and oriented to person, place, and time. No distress noted. Steady gait noted. Pleasant affect noted. Patient denies pain at this time. However, patient does report left breast discomfort associated with skin changes from radiation therapy. Patient reports using biafine cream bid to left breast. Hyperpigmentation of the left breast without desquamation noted. Patient denies changes of energy level and that she continues to go to the gym daily. Reported all findings to Dr. Mitzi Hansen.

## 2012-03-23 NOTE — Progress Notes (Signed)
   Department of Radiation Oncology  Phone:  [phone] Fax:        [phone]  Weekly Treatment Note    Name: Katherine Coleman Date: 03/23/2012 MRN: [id number] DOB: 11/26/1955   Current dose: 48.6 Gy  Current fraction: 27   MEDICATIONS: Current Outpatient Prescriptions  Medication Sig Dispense Refill  . emollient (BIAFINE) cream Apply topically as needed.      . loratadine (CLARITIN) 10 MG tablet Take 10 mg by mouth daily.      . Multiple Vitamin (MULTIVITAMIN) tablet Take 1 tablet by mouth daily.         No current facility-administered medications for this encounter.     ALLERGIES: Penicillins   LABORATORY DATA:  Lab Results  Component Value Date   WBC 5.1 01/27/2012   HGB 13.0 01/27/2012   HCT 38.9 01/27/2012   MCV 83.3 01/27/2012   PLT 226 01/27/2012   Lab Results  Component Value Date   NA 141 01/27/2012   K 3.8 01/27/2012   CL 104 01/27/2012   CO2 30* 01/27/2012   Lab Results  Component Value Date   ALT 25 01/27/2012   AST 22 01/27/2012   ALKPHOS 108 01/27/2012   BILITOT 0.57 01/27/2012     NARRATIVE: Katherine Coleman was seen today for weekly treatment management. The chart was checked and the patient's films were reviewed. The patient is doing well. She is notice a little bit of increased irritation in the upper axilla. No major changes  PHYSICAL EXAMINATION: weight is 176 lb (79.833 kg). Her oral temperature is 97.5 F (36.4 C). Her blood pressure is 140/84 and her pulse is 78. Her respiration is 18.      or pigmentation is present, really without any significant desquamation currently. Overall her skin looks good.  ASSESSMENT: The patient is doing satisfactorily with treatment.  PLAN: We will continue with the patient's radiation treatment as planned. We'll continue her current skin care

## 2012-03-24 ENCOUNTER — Other Ambulatory Visit: Payer: Self-pay

## 2012-03-26 ENCOUNTER — Ambulatory Visit
Admission: RE | Admit: 2012-03-26 | Discharge: 2012-03-26 | Disposition: A | Discharge status: Home / Self Care | Payer: Managed Care, Other (non HMO) | Source: Ambulatory Visit | Attending: Radiation Oncology | Admitting: Radiation Oncology

## 2012-03-26 DIAGNOSIS — C50419 Malignant neoplasm of upper-outer quadrant of unspecified female breast: Secondary | ICD-10-CM

## 2012-03-26 NOTE — Progress Notes (Signed)
  Radiation Oncology         [phone] ________________________________  Name: Katherine Coleman MRN: [id number]  Date: 03/26/2012  DOB: Jul 30, 1955  Simulation Verification Note  Status: outpatient  Breast Boost Field Verification  NARRATIVE: The patient was brought to the treatment unit and placed in the planned treatment position. The clinical setup was verified. Then port films were obtained and uploaded to the radiation oncology medical record software.  The treatment beams were carefully compared against the planned radiation fields. The position location and shape of the radiation fields was reviewed. They targeted volume of tissue appears to be appropriately covered by the radiation beams. Organs at risk appear to be excluded as planned.  Based on my personal review, I approved the simulation verification. The patient's treatment will proceed as planned.  ------------------------------------------------  [name], M.D.

## 2012-03-27 ENCOUNTER — Ambulatory Visit
Admission: RE | Admit: 2012-03-27 | Discharge: 2012-03-27 | Disposition: A | Discharge status: Home / Self Care | Payer: Managed Care, Other (non HMO) | Source: Ambulatory Visit | Attending: Radiation Oncology | Admitting: Radiation Oncology

## 2012-03-28 ENCOUNTER — Ambulatory Visit
Admission: RE | Admit: 2012-03-28 | Discharge: 2012-03-28 | Disposition: A | Discharge status: Home / Self Care | Payer: Managed Care, Other (non HMO) | Source: Ambulatory Visit | Attending: Radiation Oncology | Admitting: Radiation Oncology

## 2012-03-29 ENCOUNTER — Ambulatory Visit
Admission: RE | Admit: 2012-03-29 | Discharge: 2012-03-29 | Disposition: A | Discharge status: Home / Self Care | Payer: Managed Care, Other (non HMO) | Source: Ambulatory Visit | Attending: Radiation Oncology | Admitting: Radiation Oncology

## 2012-03-30 ENCOUNTER — Ambulatory Visit
Admission: RE | Admit: 2012-03-30 | Discharge: 2012-03-30 | Disposition: A | Discharge status: Home / Self Care | Payer: Managed Care, Other (non HMO) | Source: Ambulatory Visit | Attending: Radiation Oncology | Admitting: Radiation Oncology

## 2012-03-30 ENCOUNTER — Encounter: Payer: Self-pay | Admitting: Radiation Oncology

## 2012-03-30 VITALS — BP 157/93 | HR 87 | Temp 99.1°F | Wt 178.1 lb

## 2012-03-30 DIAGNOSIS — C50419 Malignant neoplasm of upper-outer quadrant of unspecified female breast: Secondary | ICD-10-CM

## 2012-03-30 NOTE — Progress Notes (Signed)
Patient here for weekly assessment of left breast radiation.Last treatment scheduled for Monday.Has sor e nipple soreness.Skin hyperpigmented.To continue with application of radiaplex, may try clear aloe vera gel as biafine not currently supplied.No fatigue

## 2012-03-30 NOTE — Progress Notes (Signed)
  Radiation Oncology         [phone] ________________________________  Name: Katherine Coleman MRN: [id number]  Date: 03/30/2012  DOB: Feb 16, 1955  Weekly Radiation Therapy Management  Current Dose: 58.4 Gy     Planned Dose:  60.4 Gy  Narrative . . . . . . . . The patient presents for routine under treatment assessment after her penultimate treatment.  The patient is noting some nipple discomfort, otherwise without complaint.                                 Set-up films were reviewed.                                 The chart was checked. Physical Findings. . .  weight is 178 lb 1.6 oz (80.786 kg). Her temperature is 99.1 F (37.3 C). Her blood pressure is 157/93 and her pulse is 87. Marland KitchenUniform hyperpigmentation of the breast with some mild desquamation at the nipple base crease. Weight essentially stable.  No significant changes. Impression . . . . . . . The patient is  tolerating radiation. Plan . . . . . . . . . . . . Continue treatment as planned with completion Monday and follow-up in one month.  ________________________________  Artist Pais. Kathrynn Running, M.D.

## 2012-04-02 ENCOUNTER — Ambulatory Visit
Admission: RE | Admit: 2012-04-02 | Discharge: 2012-04-02 | Disposition: A | Discharge status: Home / Self Care | Payer: Managed Care, Other (non HMO) | Source: Ambulatory Visit | Attending: Radiation Oncology | Admitting: Radiation Oncology

## 2012-04-02 ENCOUNTER — Encounter: Payer: Self-pay | Admitting: Radiation Oncology

## 2012-04-13 NOTE — Progress Notes (Signed)
Complex simulation note  Diagnosis: Breast cancer  Narrative The patient has initially been planned to receive a course of whole breast radiation to a dose of 50.4 gray in 28 fractions at 1.8 gray per fraction. The patient will now receive an additional boost to the seroma cavity which has been contoured. This will correspond to a boost of 10 gray in 5 fractions at 2 gray per fraction. To accomplish this, an additional 2 customized blocks have been designed for this purpose. A complex isodose plan is requested to ensure that the target area is adequately covered with radiation dose and that the nearby normal structures such as the lung are adequately spared. The patient's final total dose will be 60.4 gray.

## 2012-04-13 NOTE — Progress Notes (Signed)
  Radiation Oncology         [phone] ________________________________  Name: Katherine Coleman MRN: [id number]  Date: 04/02/2012  DOB: 02/15/1955  End of Treatment Note  Diagnosis:   Invasive ductal carcinoma of the left breast     Indication for treatment:  Curative       Radiation treatment dates:   02/13/2012 through 04/02/2012  Site/dose:   The patient was initially treated with a course of whole breast radiotherapy using tangent fields. This was treated to a dose of 50.4 gray. The patient then received a boost treatment using a 2 field photon boost technique. This delivered an additional 10 gray. The patient's final dose was 60.4 gray.  Narrative: The patient tolerated radiation treatment relatively well.   The patient experienced some skin changes including some hyperpigmentation towards the end of treatment.  Plan: The patient has completed radiation treatment. The patient will return to radiation oncology clinic for routine followup in one month. I advised the patient to call or return sooner if they have any questions or concerns related to their recovery or treatment. ________________________________  Radene Gunning, M.D., Ph.D.

## 2012-04-30 ENCOUNTER — Telehealth: Payer: Self-pay | Admitting: *Deleted

## 2012-04-30 ENCOUNTER — Ambulatory Visit (HOSPITAL_BASED_OUTPATIENT_CLINIC_OR_DEPARTMENT_OTHER): Payer: Managed Care, Other (non HMO) | Admitting: Oncology

## 2012-04-30 ENCOUNTER — Encounter: Payer: Self-pay | Admitting: Oncology

## 2012-04-30 ENCOUNTER — Other Ambulatory Visit (HOSPITAL_BASED_OUTPATIENT_CLINIC_OR_DEPARTMENT_OTHER): Payer: Managed Care, Other (non HMO) | Admitting: Lab

## 2012-04-30 VITALS — BP 186/81 | HR 98 | Temp 98.2°F | Resp 20 | Ht 67.0 in | Wt 175.5 lb

## 2012-04-30 DIAGNOSIS — Z170 Estrogen receptor positive status [ER+]: Secondary | ICD-10-CM

## 2012-04-30 DIAGNOSIS — C50412 Malignant neoplasm of upper-outer quadrant of left female breast: Secondary | ICD-10-CM

## 2012-04-30 DIAGNOSIS — C773 Secondary and unspecified malignant neoplasm of axilla and upper limb lymph nodes: Secondary | ICD-10-CM

## 2012-04-30 DIAGNOSIS — C50419 Malignant neoplasm of upper-outer quadrant of unspecified female breast: Secondary | ICD-10-CM

## 2012-04-30 LAB — CBC WITH DIFFERENTIAL/PLATELET
Basophils Absolute: 0 10*3/uL (ref 0.0–0.1)
Eosinophils Absolute: 0.1 10*3/uL (ref 0.0–0.5)
HCT: 39 % (ref 34.8–46.6)
HGB: 13.1 g/dL (ref 11.6–15.9)
MONO#: 0.8 10*3/uL (ref 0.1–0.9)
NEUT%: 51.4 % (ref 38.4–76.8)
WBC: 4 10*3/uL (ref 3.9–10.3)
lymph#: 1.1 10*3/uL (ref 0.9–3.3)

## 2012-04-30 LAB — COMPREHENSIVE METABOLIC PANEL (CC13)
BUN: 15.1 mg/dL (ref 7.0–26.0)
CO2: 28 mEq/L (ref 22–29)
Calcium: 9.4 mg/dL (ref 8.4–10.4)
Chloride: 104 mEq/L (ref 98–107)
Creatinine: 0.9 mg/dL (ref 0.6–1.1)

## 2012-04-30 MED ORDER — ANASTROZOLE 1 MG PO TABS: 1.0000 mg | ORAL_TABLET | Freq: Every day | ORAL | Status: AC

## 2012-04-30 NOTE — Telephone Encounter (Signed)
appts made and printed per KK requested for 6/19 15 mins @ 10:15am.

## 2012-04-30 NOTE — Progress Notes (Signed)
OFFICE PROGRESS NOTE  CC Dr. Carman Ching Dr. Dorothy Puffer Dr. Carmelina Peal  DIAGNOSIS: 57 year old female with stage II left invasive breast cancer  PRIOR THERAPY: 1. Patient was seen at the [facility] for new diagnosis of breast cancer. She had a 1.8 cm mass in the left breast at the 1:30 position biopsy was positive for invasive ductal carcinoma ER/PR+ Her2 negative .  2. She is now s/p lumpectomy with SNL on 10/18 which revealed a 1.5 cm grade II IDC with DCIS, ER/PR positive Her2Neu negative with Ki-67 at 62% 1 sentinel node positive for micromets.  3. Oncotype Dx testing reveals a recurrence score of 16 with 5 year distance recurrence of 10% with Tamoxifen. Putting her in the low risk category.  #4 patient is now status post completion of radiation therapy from 02/14/2012 through separate 24 2014. Overall she tolerated it well without any problems.  #5 begin Arimidex 1 mg daily starting 04/30/2012. Total of 5 years of therapy is planned.  CURRENT THERAPY:  Arimidex 1 mg daily  INTERVAL HISTORY: Katherine Coleman 57 y.o. female returns for followup after after completion of radiation therapy. She tells me she tolerated radiation well without any problems she did have a little bit of fatigue. She otherwise denies any fevers chills night sweats headaches shortness of breath chest pains palpitations no myalgias and arthralgias. Remainder of the 10 point review of systems is negative.  MEDICAL HISTORY: Past Medical History  Diagnosis Date  . Fibroid   . No pertinent past medical history   . Seasonal allergies   . Breast cancer 11/07/11    left breast 1 o'clock bx=invasive ductal ca,lymphovascular invasion  ER/PR=positive,her 2 neu=neg  . Heart murmur     "nothing to worry about", childhood  . History of breast lump/mass excision 01/12/12    re-excision left breast    ALLERGIES:  is allergic to penicillins.  MEDICATIONS:  Current Outpatient Prescriptions  Medication Sig Dispense  Refill  . Multiple Vitamin (MULTIVITAMIN) tablet Take 1 tablet by mouth daily.        Marland Kitchen anastrozole (ARIMIDEX) 1 MG tablet Take 1 tablet (1 mg total) by mouth daily.  90 tablet  12  . loratadine (CLARITIN) 10 MG tablet Take 10 mg by mouth daily.       No current facility-administered medications for this visit.    SURGICAL HISTORY:  Past Surgical History  Procedure Laterality Date  . Myomectomy    . Colonoscopy    . Breast surgery  11/25/11    Left Lumpectomy/lymph node,sentinel bx,l axillary=+ metastatic ductal ca  . Re-excision of breast cancer,superior margins  01/12/2012    Procedure: RE-EXCISION OF BREAST CANCER,SUPERIOR MARGINS;  Surgeon: Shelly Rubenstein, MD;  Location: [facility] OR;  Service: General;  Laterality: Left;  re-excision left breast cancer   . Re-excision of breast cancer,superior margins  01/12/12    left    REVIEW OF SYSTEMS:  A comprehensive review of systems was negative.   HEALTH MAINTENANCE:  PHYSICAL EXAMINATION: Blood pressure 186/81, pulse 98, temperature 98.2 F (36.8 C), temperature source Oral, resp. rate 20, height 5\' 7"  (1.702 m), weight 175 lb 8 oz (79.606 kg). Body mass index is 27.48 kg/(m^2). ECOG PERFORMANCE STATUS: 0 - Asymptomatic   General appearance: alert, cooperative and appears stated age Neck: no adenopathy, no carotid bruit, no JVD, supple, symmetrical, trachea midline and thyroid not enlarged, symmetric, no tenderness/mass/nodules Lymph nodes: Cervical, supraclavicular, and axillary nodes normal. Resp: clear to auscultation bilaterally  Back: symmetric, no curvature. ROM normal. No CVA tenderness. Cardio: regular rate and rhythm GI: soft, non-tender; bowel sounds normal; no masses,  no organomegaly Extremities: extremities normal, atraumatic, no cyanosis or edema Neurologic: Grossly normal   LABORATORY DATA: Lab Results  Component Value Date   WBC 4.0 04/30/2012   HGB 13.1 04/30/2012   HCT 39.0 04/30/2012   MCV 81.5 04/30/2012   PLT  206 04/30/2012      Chemistry      Component Value Date/Time   NA 141 04/30/2012 1129   K 3.8 04/30/2012 1129   CL 104 04/30/2012 1129   CO2 28 04/30/2012 1129   BUN 15.1 04/30/2012 1129   CREATININE 0.9 04/30/2012 1129      Component Value Date/Time   CALCIUM 9.4 04/30/2012 1129   ALKPHOS 103 04/30/2012 1129   AST 24 04/30/2012 1129   ALT 26 04/30/2012 1129   BILITOT 0.57 04/30/2012 1129     ADDITIONAL INFORMATION: 1. A sample (Block 1B) was sent to [facility] for oncotype testing. The patient's recurrence score is 16. Those patients who had a recurrence score of 16 had an average rate of distant recurrence of 10%. (JBK:eps 12/20/11) [name] MD Pathologist, Electronic Signature ( Signed 12/20/2011) 1. CHROMOGENIC IN-SITU HYBRIDIZATION Interpretation HER-2/NEU BY CISH - NO AMPLIFICATION OF HER-2 DETECTED. THE RATIO OF HER-2: CEP 17 SIGNALS WAS 1.18. Reference range: Ratio: HER2:CEP17 < 1.8 - gene amplification not observed Ratio: HER2:CEP 17 1.8-2.2 - equivocal result Ratio: HER2:CEP17 > 2.2 - gene amplification observed [name] MD Pathologist, Electronic Signature ( Signed 12/02/2011) FINAL DIAGNOSIS Diagnosis 1. Breast, lumpectomy, Left - INVASIVE GRADE II DUCTAL CARCINOMA, SPANNING 1.5 CM. - ASSOCIATED INTERMEDIATE GRADE DUCTAL CARCINOMA IN SITU. 1 of 4 FINAL for Katherine Coleman, Katherine Coleman ([id number]) Diagnosis(continued) - MEDIAL MARGIN IS BROADLY POSITIVE ON INITIAL LUMPECTOMY SPECIMEN. - TUMOR FOCALLY INVOLVES SUPERIOR MARGIN. - TUMOR IS <0.1 MM AWAY FROM ANTERIOR MARGIN. - OTHER MARGINS ARE NEGATIVE. - SEE ONCOLOGY TEMPLATE. 2. Breast, excision, Medial margin left - FIBROCYSTIC CHANGES WITH CALCIFICATION. - NO ATYPIA, HYPERPLASIA OR MALIGNANCY IDENTIFIED. 3. Lymph node, sentinel, biopsy, Left axillary - ONE LYMPH NODE, POSITIVE FOR MICROMETASTATIC DUCTAL CARCINOMA (1/1). - SEE COMMENT. Microscopic Comment 1. BREAST, INVASIVE TUMOR, WITH LYMPH NODE  SAMPLING Specimen, including laterality: Left partial breast with additional left medial margin and sentinel lymph node. Procedure: Left breast lumpectomy with additional medial margin excision and sentinel lymph node biopsy. Grade: II. Tubule formation: 3. Nuclear pleomorphism: 2. Mitotic: 1. Tumor size (gross measurement): 1.5 cm. Margins: Invasive, distance to closest margin: Medial margin on initial lumpectomy specimen is broadly positive; superior margin is focally positive and anterior margin is extremely close <0.1 mm; Of note, the additional medial left margin received is negative for carcinoma -- please correlate with operative impression for completeness of resection of the medial aspect. In-situ, distance to closest margin: At least 0.2 cm. Lymphovascular invasion: Yes, present. Ductal carcinoma in situ: Yes. Grade: Intermediate grade. Extensive intraductal component: No. Lobular neoplasia: No. Tumor focality: Unifocal. Treatment effect: Not applicable. Extent of tumor: Tumor confined to breast parenchyma. Lymph nodes: # examined: 1. Lymph nodes with metastasis: 1. Micrometastasis: (> 0.2 mm and < 2.0 mm): 1. Breast prognostic profile: Performed on previous case [id number]: Estrogen receptor: 100%, positive Progesterone receptor: 63%, positive Her 2 neu: 1.56, no amplification Ki-67: 62% Non-neoplastic breast: Incidental benign fibroadenoma, fibrocystic changes with calcifications. TNM: pT1c, pN49mi, MX. Comments: A cytokeratin AE1/AE3 is performed on a single block of tumor to  assess the margin status. A Her-2 neu by CISH will be repeated on the current tumor and reported in an addendum. 3. A cytokeratin AE1/AE3 stain is performed which is positive and helps to highlight the extent of involvement. There is not a cluster of tumor cells present which is greater than 0.2 cm (2 mm). Therefore, the metastasis 2 of 4 FINAL for Katherine Coleman, Katherine Coleman ([id number]) Microscopic  Comment(continued) is best characterized as a micrometastasis. Dr. Colonel Bald has seen the left axillary sentinel lymph node biopsy with a  RADIOGRAPHIC STUDIES:  No results found.  ASSESSMENT: 57 year old female with   1. Stage I IDC with 1 micromet to lymph node. S/p lumpectomy. Tumor ER positive, her2 negative. Margin was positive on final path. Patient's Oncotype testing score was in the low risk category. Therefore she went on to receive radiation therapy. She completed her radiation therapy from 02/14/2012 through 04/02/2012. She tolerated it well.  #2 patient will now proceed with adjuvant antiestrogen therapy consisting of Arimidex 1 mg daily. We discussed risks complications and benefits of the treatment. She understands she will take this daily for a total of 5 years.  PLAN:  #1 proceed with Arimidex 1 mg daily.  #2 I will see her back in 3 months time for followup.   All questions were answered. The patient knows to call the clinic with any problems, questions or concerns. We can certainly see the patient much sooner if necessary.  I spent 25 minutes counseling the patient face to face. The total time spent in the appointment was 30 minutes.    Drue Second, MD Medical/Oncology [facility] [phone] (beeper) [phone] (Office)  04/30/2012, 4:54 PM

## 2012-04-30 NOTE — Patient Instructions (Addendum)
Proceed with arimidex 1 mg daily, prescription sent to your pharmacy  Take caltrate 2 day   Take  Vitamin D3 1000 iu daily  I will see you back in 3 months  Anastrozole tablets What is this medicine? ANASTROZOLE (an AS troe zole) is used to treat breast cancer in women who have gone through menopause. Some types of breast cancer depend on estrogen to grow, and this medicine can stop tumor growth by blocking estrogen production. This medicine may be used for other purposes; ask your health care provider or pharmacist if you have questions. What should I tell my health care provider before I take this medicine? They need to know if you have any of these conditions: -liver disease -an unusual or allergic reaction to anastrozole, other medicines, foods, dyes, or preservatives -pregnant or trying to get pregnant -breast-feeding How should I use this medicine? Take this medicine by mouth with a glass of water. Follow the directions on the prescription label. You can take this medicine with or without food. Take your doses at regular intervals. Do not take your medicine more often than directed. Do not stop taking except on the advice of your doctor or health care professional. Talk to your pediatrician regarding the use of this medicine in children. Special care may be needed. Overdosage: If you think you have taken too much of this medicine contact a poison control center or emergency room at once. NOTE: This medicine is only for you. Do not share this medicine with others. What if I miss a dose? If you miss a dose, take it as soon as you can. If it is almost time for your next dose, take only that dose. Do not take double or extra doses. What may interact with this medicine? Do not take this medicine with any of the following medications: -female hormones, like estrogens or progestins and birth control pills This medicine may also interact with the following medications: -tamoxifen This  list may not describe all possible interactions. Give your health care provider a list of all the medicines, herbs, non-prescription drugs, or dietary supplements you use. Also tell them if you smoke, drink alcohol, or use illegal drugs. Some items may interact with your medicine. What should I watch for while using this medicine? Visit your doctor or health care professional for regular checks on your progress. Let your doctor or health care professional know about any unusual vaginal bleeding. Do not treat yourself for diarrhea, nausea, vomiting or other side effects. Ask your doctor or health care professional for advice. What side effects may I notice from receiving this medicine? Side effects that you should report to your doctor or health care professional as soon as possible: -allergic reactions like skin rash, itching or hives, swelling of the face, lips, or tongue -any new or unusual symptoms -breathing problems -chest pain -leg pain or swelling -vomiting Side effects that usually do not require medical attention (report to your doctor or health care professional if they continue or are bothersome): -back or bone pain -cough, or throat infection -diarrhea or constipation -dizziness -headache -hot flashes -loss of appetite -nausea -sweating -weakness and tiredness -weight gain This list may not describe all possible side effects. Call your doctor for medical advice about side effects. You may report side effects to FDA at 1-800-FDA-1088. Where should I keep my medicine? Keep out of the reach of children. Store at room temperature between 20 and 25 degrees C (68 and 77 degrees F). Throw away any  unused medicine after the expiration date. NOTE: This sheet is a summary. It may not cover all possible information. If you have questions about this medicine, talk to your doctor, pharmacist, or health care provider.  2012, Elsevier/Gold Standard. (04/06/2007 4:31:52 PM)

## 2012-05-01 ENCOUNTER — Encounter: Payer: Self-pay | Admitting: Radiation Oncology

## 2012-05-03 ENCOUNTER — Ambulatory Visit
Admission: RE | Admit: 2012-05-03 | Discharge: 2012-05-03 | Disposition: A | Discharge status: Home / Self Care | Payer: Managed Care, Other (non HMO) | Source: Ambulatory Visit | Attending: Radiation Oncology | Admitting: Radiation Oncology

## 2012-05-03 VITALS — BP 157/92 | HR 68 | Temp 97.9°F | Wt 176.8 lb

## 2012-05-03 DIAGNOSIS — C50412 Malignant neoplasm of upper-outer quadrant of left female breast: Secondary | ICD-10-CM

## 2012-05-03 NOTE — Progress Notes (Signed)
  Radiation Oncology         [phone] ________________________________  Name: Katherine Coleman MRN: [id number]  Date: 05/03/2012  DOB: 04-09-1955  Follow-Up Visit Note  CC: Provider Not In System  Victorino December, MD  Diagnosis:   Invasive ductal carcinoma of the left breast  Interval Since Last Radiation:  One month   Narrative:  The patient returns today for routine follow-up.  The patient states that she has done 3 well. She is pleased with how her skin has healed. He continues to use some Aquaphor skin cream. Her energy level has been good. She has begun anti-hormonal treatment through Dr.,.                              ALLERGIES:  is allergic to penicillins.  Meds: Current Outpatient Prescriptions  Medication Sig Dispense Refill  . anastrozole (ARIMIDEX) 1 MG tablet Take 1 tablet (1 mg total) by mouth daily.  90 tablet  12  . Calcium-Vitamin D (CALTRATE 600 PLUS-VIT D PO) Take by mouth 2 (two) times daily.      . cholecalciferol (VITAMIN D) 1000 UNITS tablet Take 1,000 Units by mouth daily.      Marland Kitchen loratadine (CLARITIN) 10 MG tablet Take 10 mg by mouth daily.      . Multiple Vitamin (MULTIVITAMIN) tablet Take 1 tablet by mouth daily.         No current facility-administered medications for this encounter.    Physical Findings: The patient is in no acute distress. Patient is alert and oriented.  weight is 176 lb 12.8 oz (80.196 kg). Her temperature is 97.9 F (36.6 C). Her blood pressure is 157/92 and her pulse is 68. .   Some diffuse hyperpigmentation is present. Overall her skin looks 3 good. No desquamation.  Lab Findings: Lab Results  Component Value Date   WBC 4.0 04/30/2012   HGB 13.1 04/30/2012   HCT 39.0 04/30/2012   MCV 81.5 04/30/2012   PLT 206 04/30/2012     Radiographic Findings: No results found.  Impression:    The patient is doing very well 1 month after her course of adjuvant radiotherapy.  Plan:  The patient will return to clinic on a when necessary  basis.   Radene Gunning, M.D., Ph.D.

## 2012-05-03 NOTE — Progress Notes (Signed)
Patient here for routine one month assessment of left breast radiation. Skin color almost back to normal.Started arimidex  On 3/324/14.No pain or fatigue.

## 2012-05-14 ENCOUNTER — Ambulatory Visit (INDEPENDENT_AMBULATORY_CARE_PROVIDER_SITE_OTHER): Payer: Managed Care, Other (non HMO) | Admitting: Gynecology

## 2012-05-14 ENCOUNTER — Encounter: Payer: Self-pay | Admitting: Obstetrics and Gynecology

## 2012-05-14 ENCOUNTER — Encounter: Payer: Self-pay | Admitting: Gynecology

## 2012-05-14 VITALS — BP 120/78 | Ht 67.0 in | Wt 174.0 lb

## 2012-05-14 DIAGNOSIS — Z01419 Encounter for gynecological examination (general) (routine) without abnormal findings: Secondary | ICD-10-CM

## 2012-05-14 NOTE — Patient Instructions (Signed)
Followup in one year for her gynecologic exam. Continue to follow up with her oncologist and primary care physician.

## 2012-05-14 NOTE — Progress Notes (Signed)
Katherine Coleman [birth year] [id number]        57 y.o.  G1P1001 for annual exam.  Former patient of Dr. Eda Paschal. Doing well from a gynecologic standpoint. Recently finished radiation for her breast cancer.  Past medical history,surgical history, medications, allergies, family history and social history were all reviewed and documented in the EPIC chart. ROS:  Was performed and pertinent positives and negatives are included in the history.  Exam: Kim assistant Filed Vitals:   05/14/12 1507  BP: 120/78  Height: 5\' 7"  (1.702 m)  Weight: 174 lb (78.926 kg)   General appearance  Normal Skin grossly normal Head/Neck normal with no cervical or supraclavicular adenopathy thyroid normal Lungs  clear Cardiac RR, without RMG Abdominal  soft, nontender, without masses, organomegaly or hernia Breasts  examined lying and sitting without masses, retractions, discharge or axillary adenopathy. Pelvic  Ext/BUS/vagina  normal with atrophic changes  Cervix  normal with atrophic changes  Uterus  axial, normal size, shape and contour, midline and mobile nontender   Adnexa  Without masses or tenderness    Anus and perineum  normal   Rectovaginal  normal sphincter tone without palpated masses or tenderness.    Assessment/Plan:  57 y.o. G37P1001 female for annual exam.   1. Breast cancer. Status post left lumpectomy. Finished radiation and now has been started on anastrozole. Actively being followed by oncology. 2. History of leiomyoma, status post open myomectomy 1997. Exam today is normal. Continue with annual exam. 3. DEXA January 2012 normal. She has one scheduled through her oncologist I asked her just to check with them to see if they still want to do this was a normal report 2012. Increase calcium vitamin D reviewed. 4. Pap smear 2012. No Pap smear done today. No history of abnormal Pap smears previously. Plan repeat next year at 3 year interval. 5. Colonoscopy 2009. Repeat at their recommended  interval. 6. Health maintenance. The lab work done this is all done through her oncologist and primary physician's office. Followup one year, sooner as needed.    Dara Lords MD, 3:29 PM 05/14/2012

## 2012-05-16 ENCOUNTER — Encounter: Payer: Self-pay | Admitting: Oncology

## 2012-05-21 ENCOUNTER — Telehealth: Payer: Self-pay | Admitting: *Deleted

## 2012-05-21 ENCOUNTER — Encounter: Payer: Self-pay | Admitting: Oncology

## 2012-05-21 NOTE — Telephone Encounter (Signed)
Called patient and she reports her vaginal discharge is a little yellow with no odor or itching. She mentioned it to Dr. Audie Box last week and he suggested she call him if it continues and she will follow up with him about this. Made her aware that she does need to proceed with bone density test now-should be covered. Every 2 years is standard of care, especially now that she is on an aromatase inhibitor. She reports someone had called last week and told her the same thing. Appreciates that the answers are consistent.

## 2012-05-22 ENCOUNTER — Other Ambulatory Visit: Payer: Self-pay | Admitting: *Deleted

## 2012-05-22 ENCOUNTER — Encounter: Payer: Self-pay | Admitting: Gynecology

## 2012-05-22 MED ORDER — METRONIDAZOLE 0.75 % VA GEL: 1.0000 | Freq: Two times a day (BID) | VAGINAL | Status: DC

## 2012-05-30 ENCOUNTER — Ambulatory Visit
Admission: RE | Admit: 2012-05-30 | Discharge: 2012-05-30 | Disposition: A | Discharge status: Home / Self Care | Payer: Managed Care, Other (non HMO) | Source: Ambulatory Visit | Attending: Oncology | Admitting: Oncology

## 2012-05-30 DIAGNOSIS — C50412 Malignant neoplasm of upper-outer quadrant of left female breast: Secondary | ICD-10-CM

## 2012-07-26 ENCOUNTER — Encounter: Payer: Self-pay | Admitting: Oncology

## 2012-07-26 ENCOUNTER — Ambulatory Visit (HOSPITAL_BASED_OUTPATIENT_CLINIC_OR_DEPARTMENT_OTHER): Payer: Managed Care, Other (non HMO) | Admitting: Oncology

## 2012-07-26 ENCOUNTER — Telehealth: Payer: Self-pay | Admitting: *Deleted

## 2012-07-26 ENCOUNTER — Other Ambulatory Visit (HOSPITAL_BASED_OUTPATIENT_CLINIC_OR_DEPARTMENT_OTHER): Payer: Managed Care, Other (non HMO) | Admitting: Lab

## 2012-07-26 VITALS — BP 162/78 | HR 92 | Temp 97.2°F | Resp 20 | Ht 67.0 in | Wt 172.5 lb

## 2012-07-26 DIAGNOSIS — C50412 Malignant neoplasm of upper-outer quadrant of left female breast: Secondary | ICD-10-CM

## 2012-07-26 DIAGNOSIS — C50419 Malignant neoplasm of upper-outer quadrant of unspecified female breast: Secondary | ICD-10-CM

## 2012-07-26 LAB — COMPREHENSIVE METABOLIC PANEL (CC13)
ALT: 17 U/L (ref 0–55)
AST: 24 U/L (ref 5–34)
Albumin: 4.1 g/dL (ref 3.5–5.0)
BUN: 10.9 mg/dL (ref 7.0–26.0)
Calcium: 9.6 mg/dL (ref 8.4–10.4)
Chloride: 104 mEq/L (ref 98–107)
Potassium: 3.7 mEq/L (ref 3.5–5.1)
Sodium: 141 mEq/L (ref 136–145)
Total Protein: 7.3 g/dL (ref 6.4–8.3)

## 2012-07-26 LAB — CBC WITH DIFFERENTIAL/PLATELET
BASO%: 0.7 % (ref 0.0–2.0)
Basophils Absolute: 0 10*3/uL (ref 0.0–0.1)
HCT: 36.9 % (ref 34.8–46.6)
HGB: 12.4 g/dL (ref 11.6–15.9)
MCHC: 33.7 g/dL (ref 31.5–36.0)
MONO#: 0.4 10*3/uL (ref 0.1–0.9)
NEUT%: 61.2 % (ref 38.4–76.8)
RDW: 14.5 % (ref 11.2–14.5)
WBC: 3.7 10*3/uL — ABNORMAL LOW (ref 3.9–10.3)
lymph#: 0.9 10*3/uL (ref 0.9–3.3)

## 2012-07-26 NOTE — Patient Instructions (Addendum)
Continue arimidex   No clinical evidence of recurrent cancer  We will see you back in 6 months

## 2012-07-26 NOTE — Progress Notes (Signed)
OFFICE PROGRESS NOTE  CC Dr. Carman Coleman Dr. Dorothy Coleman Dr. Carmelina Coleman  DIAGNOSIS: 57 year old female with stage II left invasive breast cancer  PRIOR THERAPY: 1. Patient was seen at the [facility] for new diagnosis of breast cancer. She had a 1.8 cm mass in the left breast at the 1:30 position biopsy was positive for invasive ductal carcinoma ER/PR+ Her2 negative .  2. She is now s/p lumpectomy with SNL on 10/18 which revealed a 1.5 cm grade II IDC with DCIS, ER/PR positive Her2Neu negative with Ki-67 at 62% 1 sentinel node positive for micromets.  3. Oncotype Dx testing reveals a recurrence score of 16 with 5 year distance recurrence of 10% with Tamoxifen. Putting her in the low risk category.  #4 patient is now status post completion of radiation therapy from 02/14/2012 through separate 24 2014. Overall she tolerated it well without any problems.  #5 begin Arimidex 1 mg daily starting 04/30/2012. Total of 5 years of therapy is planned.  CURRENT THERAPY:  Arimidex 1 mg daily  INTERVAL HISTORY: Katherine Coleman 57 y.o. female returns for followup. She tells me she tolerated arimidex. well without any problems she did have a little bit of fatigue. She otherwise denies any fevers chills night sweats headaches shortness of breath chest pains palpitations no myalgias and arthralgias. Remainder of the 10 point review of systems is negative.  MEDICAL HISTORY: Past Medical History  Diagnosis Date  . Fibroid   . No pertinent past medical history   . Seasonal allergies   . Breast cancer 11/07/11    left breast 1 o'clock bx=invasive ductal ca,lymphovascular invasion  ER/PR=positive,her 2 neu=neg  . Heart murmur     "nothing to worry about", childhood  . History of breast lump/mass excision 01/12/12    re-excision left breast  . Radiation 02/13/12-04/02/12    Left breast 60.4 gray    ALLERGIES:  is allergic to penicillins.  MEDICATIONS:  Current Outpatient Prescriptions  Medication Sig  Dispense Refill  . anastrozole (ARIMIDEX) 1 MG tablet Take 1 mg by mouth daily.      . Calcium-Vitamin D (CALTRATE 600 PLUS-VIT D PO) Take by mouth 2 (two) times daily.      . cholecalciferol (VITAMIN D) 1000 UNITS tablet Take 1,000 Units by mouth daily.      Marland Kitchen loratadine (CLARITIN) 10 MG tablet Take 10 mg by mouth daily.      . Multiple Vitamin (MULTIVITAMIN) tablet Take 1 tablet by mouth daily.         No current facility-administered medications for this visit.    SURGICAL HISTORY:  Past Surgical History  Procedure Laterality Date  . Myomectomy  1997  . Colonoscopy    . Breast surgery  11/25/11    Left Lumpectomy/lymph node,sentinel bx,l axillary=+ metastatic ductal ca  . Re-excision of breast cancer,superior margins  01/12/2012    Procedure: RE-EXCISION OF BREAST CANCER,SUPERIOR MARGINS;  Surgeon: Katherine Rubenstein, [facility];  Location: [facility] OR;  Service: General;  Laterality: Left;  re-excision left breast cancer   . Re-excision of breast cancer,superior margins  01/12/12    left    REVIEW OF SYSTEMS:  A comprehensive review of systems was negative.   HEALTH MAINTENANCE:  PHYSICAL EXAMINATION: Blood pressure 162/78, pulse 92, temperature 97.2 F (36.2 C), temperature source Oral, resp. rate 20, height 5\' 7"  (1.702 m), weight 172 lb 8 oz (78.245 kg). Body mass index is 27.01 kg/(m^2). ECOG PERFORMANCE STATUS: 0 - Asymptomatic   General  appearance: alert, cooperative and appears stated age Neck: no adenopathy, no carotid bruit, no JVD, supple, symmetrical, trachea midline and thyroid not enlarged, symmetric, no tenderness/mass/nodules Lymph nodes: Cervical, supraclavicular, and axillary nodes normal. Resp: clear to auscultation bilaterally Back: symmetric, no curvature. ROM normal. No CVA tenderness. Cardio: regular rate and rhythm GI: soft, non-tender; bowel sounds normal; no masses,  no organomegaly Extremities: extremities normal, atraumatic, no cyanosis or edema Neurologic:  Grossly normal   LABORATORY DATA: Lab Results  Component Value Date   WBC 3.7* 07/26/2012   HGB 12.4 07/26/2012   HCT 36.9 07/26/2012   MCV 81.2 07/26/2012   PLT 188 07/26/2012      Chemistry      Component Value Date/Time   NA 141 07/26/2012 0948   K 3.7 07/26/2012 0948   CL 104 07/26/2012 0948   CO2 26 07/26/2012 0948   BUN 10.9 07/26/2012 0948   CREATININE 0.9 07/26/2012 0948      Component Value Date/Time   CALCIUM 9.6 07/26/2012 0948   ALKPHOS 86 07/26/2012 0948   AST 24 07/26/2012 0948   ALT 17 07/26/2012 0948   BILITOT 0.80 07/26/2012 0948     ADDITIONAL INFORMATION: 1. A sample (Block 1B) was sent to [facility] for oncotype testing. The patient's recurrence score is 16. Those patients who had a recurrence score of 16 had an average rate of distant recurrence of 10%. (Katherine Coleman:eps 12/20/11) Katherine Coleman Pathologist, Electronic Signature ( Signed 12/20/2011) 1. CHROMOGENIC IN-SITU HYBRIDIZATION Interpretation HER-2/NEU BY CISH - NO AMPLIFICATION OF HER-2 DETECTED. THE RATIO OF HER-2: CEP 17 SIGNALS WAS 1.18. Reference range: Ratio: HER2:CEP17 < 1.8 - gene amplification not observed Ratio: HER2:CEP 17 1.8-2.2 - equivocal result Ratio: HER2:CEP17 > 2.2 - gene amplification observed Katherine Picket [facility] Pathologist, Electronic Signature ( Signed 12/02/2011) FINAL DIAGNOSIS Diagnosis 1. Breast, lumpectomy, Left - INVASIVE GRADE II DUCTAL CARCINOMA, SPANNING 1.5 CM. - ASSOCIATED INTERMEDIATE GRADE DUCTAL CARCINOMA IN SITU. 1 of 4 FINAL for Katherine Coleman ([id number]) Diagnosis(continued) - MEDIAL MARGIN IS BROADLY POSITIVE ON INITIAL LUMPECTOMY SPECIMEN. - TUMOR FOCALLY INVOLVES SUPERIOR MARGIN. - TUMOR IS <0.1 MM AWAY FROM ANTERIOR MARGIN. - OTHER MARGINS ARE NEGATIVE. - SEE ONCOLOGY TEMPLATE. 2. Breast, excision, Medial margin left - FIBROCYSTIC CHANGES WITH CALCIFICATION. - NO ATYPIA, HYPERPLASIA OR MALIGNANCY IDENTIFIED. 3. Lymph node, sentinel, biopsy, Left  axillary - ONE LYMPH NODE, POSITIVE FOR MICROMETASTATIC DUCTAL CARCINOMA (1/1). - SEE COMMENT. Microscopic Comment 1. BREAST, INVASIVE TUMOR, WITH LYMPH NODE SAMPLING Specimen, including laterality: Left partial breast with additional left medial margin and sentinel lymph node. Procedure: Left breast lumpectomy with additional medial margin excision and sentinel lymph node biopsy. Grade: II. Tubule formation: 3. Nuclear pleomorphism: 2. Mitotic: 1. Tumor size (gross measurement): 1.5 cm. Margins: Invasive, distance to closest margin: Medial margin on initial lumpectomy specimen is broadly positive; superior margin is focally positive and anterior margin is extremely close <0.1 mm; Of note, the additional medial left margin received is negative for carcinoma -- please correlate with operative impression for completeness of resection of the medial aspect. In-situ, distance to closest margin: At least 0.2 cm. Lymphovascular invasion: Yes, present. Ductal carcinoma in situ: Yes. Grade: Intermediate grade. Extensive intraductal component: No. Lobular neoplasia: No. Tumor focality: Unifocal. Treatment effect: Not applicable. Extent of tumor: Tumor confined to breast parenchyma. Lymph nodes: # examined: 1. Lymph nodes with metastasis: 1. Micrometastasis: (> 0.2 mm and < 2.0 mm): 1. Breast prognostic profile: Performed on previous case [id number]: Estrogen receptor: 100%,  positive Progesterone receptor: 63%, positive Her 2 neu: 1.56, no amplification Ki-67: 62% Non-neoplastic breast: Incidental benign fibroadenoma, fibrocystic changes with calcifications. TNM: pT1c, pN57mi, MX. Comments: A cytokeratin AE1/AE3 is performed on a single block of tumor to assess the margin status. A Her-2 neu by CISH will be repeated on the current tumor and reported in an addendum. 3. A cytokeratin AE1/AE3 stain is performed which is positive and helps to highlight the extent of involvement. There is  not a cluster of tumor cells present which is greater than 0.2 cm (2 mm). Therefore, the metastasis 2 of 4 FINAL for Katherine Coleman, Katherine Coleman ([id number]) Microscopic Comment(continued) is best characterized as a micrometastasis. Dr. Colonel Bald has seen the left axillary sentinel lymph node biopsy with a  RADIOGRAPHIC STUDIES:  No results found.  ASSESSMENT: 57 year old female with   1. Stage I IDC with 1 micromet to lymph node. S/p lumpectomy. Tumor ER positive, her2 negative. Margin was positive on final path. Patient's Oncotype testing score was in the low risk category. Therefore she went on to receive radiation therapy. She completed her radiation therapy from 02/14/2012 through 04/02/2012. She tolerated it well.  #2 patient will now proceed with adjuvant antiestrogen therapy consisting of Arimidex 1 mg daily. We discussed risks complications and benefits of the treatment. She understands she will take this daily for a total of 5 years.  PLAN:  #1Continue arimidex   #2No clinical evidence of recurrent cancer  #3We will see you back in 6 months  All questions were answered. The patient knows to call the clinic with any problems, questions or concerns. We can certainly see the patient much sooner if necessary.  I spent 25 minutes counseling the patient face to face. The total time spent in the appointment was 30 minutes.    Drue Second, [facility] Medical/Oncology [facility] [phone] (beeper) [phone] (Office)  07/26/2012, 10:48 AM

## 2012-07-26 NOTE — Telephone Encounter (Signed)
appts made and printed...td 

## 2012-08-02 ENCOUNTER — Telehealth: Payer: Self-pay | Admitting: Medical Oncology

## 2012-08-02 NOTE — Telephone Encounter (Signed)
Informed pt form for [facility] Biometric Screening Results & Attestation that she mailed to [place] for Dr Welton Flakes to fill out has been placed in mail for her. Patient expressed thanks.

## 2012-09-03 ENCOUNTER — Encounter (INDEPENDENT_AMBULATORY_CARE_PROVIDER_SITE_OTHER): Payer: Self-pay | Admitting: Surgery

## 2012-09-03 ENCOUNTER — Ambulatory Visit (INDEPENDENT_AMBULATORY_CARE_PROVIDER_SITE_OTHER): Payer: Managed Care, Other (non HMO) | Admitting: Surgery

## 2012-09-03 VITALS — BP 104/68 | HR 66 | Temp 98.0°F | Resp 14 | Ht 67.0 in | Wt 173.6 lb

## 2012-09-03 DIAGNOSIS — Z853 Personal history of malignant neoplasm of breast: Secondary | ICD-10-CM

## 2012-09-03 NOTE — Progress Notes (Signed)
Subjective:     Patient ID: Katherine Coleman, female   DOB: Feb 14, 1955, 57 y.o.   MRN: [id number]  HPI She is here for a long-term followup of her left breast cancer. She is on Anti-hormonal therapy. And is doing well except for some hot flashes.  She has had no nipple discharge or arm swelling  Review of Systems     Objective:   Physical Exam On exam, still fullness at the lumpectomy site. The axillary incision is clean and there is no adenopathy    Assessment:     Patient with a history of stage II left breast cancer status post excision and reexcision for positive margins with Sentinel node biopsy     Plan:     I will see her back in 6 months. She will be due for a mammogram in September. I will see her back sooner if there are any issues

## 2012-10-17 ENCOUNTER — Other Ambulatory Visit (INDEPENDENT_AMBULATORY_CARE_PROVIDER_SITE_OTHER): Payer: Self-pay | Admitting: Surgery

## 2012-10-17 DIAGNOSIS — Z853 Personal history of malignant neoplasm of breast: Secondary | ICD-10-CM

## 2012-11-06 ENCOUNTER — Other Ambulatory Visit: Payer: Self-pay | Admitting: Gynecology

## 2012-11-06 DIAGNOSIS — Z853 Personal history of malignant neoplasm of breast: Secondary | ICD-10-CM

## 2012-11-07 ENCOUNTER — Ambulatory Visit
Admission: RE | Admit: 2012-11-07 | Discharge: 2012-11-07 | Disposition: A | Discharge status: Home / Self Care | Payer: Managed Care, Other (non HMO) | Source: Ambulatory Visit | Attending: Surgery | Admitting: Surgery

## 2012-11-07 DIAGNOSIS — Z853 Personal history of malignant neoplasm of breast: Secondary | ICD-10-CM

## 2012-12-13 ENCOUNTER — Other Ambulatory Visit: Payer: Self-pay

## 2013-01-07 ENCOUNTER — Telehealth: Payer: Self-pay | Admitting: Oncology

## 2013-01-22 ENCOUNTER — Encounter (INDEPENDENT_AMBULATORY_CARE_PROVIDER_SITE_OTHER): Payer: Self-pay | Admitting: Surgery

## 2013-01-25 ENCOUNTER — Other Ambulatory Visit: Payer: Managed Care, Other (non HMO) | Admitting: Lab

## 2013-01-25 ENCOUNTER — Ambulatory Visit: Payer: Managed Care, Other (non HMO) | Admitting: Oncology

## 2013-02-12 ENCOUNTER — Ambulatory Visit (INDEPENDENT_AMBULATORY_CARE_PROVIDER_SITE_OTHER): Payer: Managed Care, Other (non HMO) | Admitting: Gynecology

## 2013-02-12 ENCOUNTER — Encounter: Payer: Self-pay | Admitting: Gynecology

## 2013-02-12 DIAGNOSIS — N898 Other specified noninflammatory disorders of vagina: Secondary | ICD-10-CM

## 2013-02-12 DIAGNOSIS — N952 Postmenopausal atrophic vaginitis: Secondary | ICD-10-CM

## 2013-02-12 LAB — WET PREP FOR TRICH, YEAST, CLUE
Clue Cells Wet Prep HPF POC: NONE SEEN
TRICH WET PREP: NONE SEEN
YEAST WET PREP: NONE SEEN

## 2013-02-12 MED ORDER — METRONIDAZOLE 500 MG PO TABS
500.0000 mg | ORAL_TABLET | Freq: Two times a day (BID) | ORAL | Status: DC
Start: 2013-02-12 — End: 2013-03-08

## 2013-02-12 NOTE — Progress Notes (Signed)
Patient presents with several days of vaginal irritation and discharge. Had similar symptoms in April where we called her in [facility] and a result her symptoms. No odor or itching. No urinary symptoms such as frequency dysuria or urgency. No pelvic pain.  Exam was Public librarian vagina with atrophic changes. White discharge noted. Cervix grossly normal. Uterus normal size midline mobile nontender. Adnexa without masses or tenderness.  Assessment and plan: Vaginal discharge with wet prep and exam consistent with a low-grade bacterial vaginosis. Treat with Flagyl 500 mg twice a day x7 days, alcohol avoidance reviewed. Followup if symptoms persist, worsen or recur.

## 2013-02-12 NOTE — Patient Instructions (Signed)
Take antibiotics twice daily for 7 days. Avoid alcohol while taking. Followup if symptoms persist, worsen or recur.

## 2013-03-08 ENCOUNTER — Other Ambulatory Visit (HOSPITAL_BASED_OUTPATIENT_CLINIC_OR_DEPARTMENT_OTHER): Payer: Managed Care, Other (non HMO)

## 2013-03-08 ENCOUNTER — Ambulatory Visit (HOSPITAL_BASED_OUTPATIENT_CLINIC_OR_DEPARTMENT_OTHER): Payer: Managed Care, Other (non HMO) | Admitting: Oncology

## 2013-03-08 ENCOUNTER — Encounter: Payer: Self-pay | Admitting: Oncology

## 2013-03-08 VITALS — BP 175/96 | HR 79 | Temp 97.8°F | Resp 18 | Ht 67.0 in | Wt 178.4 lb

## 2013-03-08 DIAGNOSIS — C50412 Malignant neoplasm of upper-outer quadrant of left female breast: Secondary | ICD-10-CM

## 2013-03-08 DIAGNOSIS — C50419 Malignant neoplasm of upper-outer quadrant of unspecified female breast: Secondary | ICD-10-CM

## 2013-03-08 DIAGNOSIS — Z170 Estrogen receptor positive status [ER+]: Secondary | ICD-10-CM

## 2013-03-08 LAB — COMPREHENSIVE METABOLIC PANEL (CC13)
ALBUMIN: 4.4 g/dL (ref 3.5–5.0)
ALT: 30 U/L (ref 0–55)
ANION GAP: 12 meq/L — AB (ref 3–11)
AST: 24 U/L (ref 5–34)
Alkaline Phosphatase: 93 U/L (ref 40–150)
BILIRUBIN TOTAL: 0.59 mg/dL (ref 0.20–1.20)
BUN: 10.9 mg/dL (ref 7.0–26.0)
CALCIUM: 9.6 mg/dL (ref 8.4–10.4)
CHLORIDE: 103 meq/L (ref 98–109)
CO2: 26 meq/L (ref 22–29)
Creatinine: 0.8 mg/dL (ref 0.6–1.1)
Glucose: 110 mg/dl (ref 70–140)
POTASSIUM: 3.8 meq/L (ref 3.5–5.1)
SODIUM: 141 meq/L (ref 136–145)
TOTAL PROTEIN: 7.5 g/dL (ref 6.4–8.3)

## 2013-03-08 LAB — CBC WITH DIFFERENTIAL/PLATELET
BASO%: 0.6 % (ref 0.0–2.0)
Basophils Absolute: 0 10*3/uL (ref 0.0–0.1)
EOS%: 1.2 % (ref 0.0–7.0)
Eosinophils Absolute: 0.1 10*3/uL (ref 0.0–0.5)
HCT: 40.6 % (ref 34.8–46.6)
HGB: 13.3 g/dL (ref 11.6–15.9)
LYMPH#: 1.6 10*3/uL (ref 0.9–3.3)
LYMPH%: 36.5 % (ref 14.0–49.7)
MCH: 27.3 pg (ref 25.1–34.0)
MCHC: 32.9 g/dL (ref 31.5–36.0)
MCV: 83 fL (ref 79.5–101.0)
MONO#: 0.6 10*3/uL (ref 0.1–0.9)
MONO%: 13.5 % (ref 0.0–14.0)
NEUT#: 2.2 10*3/uL (ref 1.5–6.5)
NEUT%: 48.2 % (ref 38.4–76.8)
Platelets: 198 10*3/uL (ref 145–400)
RBC: 4.89 10*6/uL (ref 3.70–5.45)
RDW: 14.8 % — AB (ref 11.2–14.5)
WBC: 4.5 10*3/uL (ref 3.9–10.3)

## 2013-03-08 NOTE — Progress Notes (Signed)
OFFICE PROGRESS NOTE  CC Dr. Nedra Hai Dr. Kyung Rudd Dr. Vanetta Mulders  DIAGNOSIS: 58 year old female with stage II left invasive breast cancer  PRIOR THERAPY: 1. Patient was seen at the [facility] for new diagnosis of breast cancer. She had a 1.8 cm mass in the left breast at the 1:30 position biopsy was positive for invasive ductal carcinoma ER/PR+ Her2 negative .  2. She is now s/p lumpectomy with SNL on 10/18 which revealed a 1.5 cm grade II IDC with DCIS, ER/PR positive Her2Neu negative with Ki-67 at 62% 1 sentinel node positive for micromets.  3. Oncotype Dx testing reveals a recurrence score of 16 with 5 year distance recurrence of 10% with Tamoxifen. Putting her in the low risk category.  #4 patient is now status post completion of radiation therapy from 02/14/2012 through separate 24 2014. Overall she tolerated it well without any problems.  #5 Arimidex 1 mg daily starting 04/30/2012. Total of 5 years of therapy is planned.  CURRENT THERAPY:  Arimidex 1 mg daily  INTERVAL HISTORY: Katherine Coleman 58 y.o. female returns for followup. She tells me she tolerated arimidex. well without any problems she did have a little bit of fatigue. She otherwise denies any fevers chills night sweats headaches shortness of breath chest pains palpitations no myalgias and arthralgias. Remainder of the 10 point review of systems is negative.  MEDICAL HISTORY: Past Medical History  Diagnosis Date  . Fibroid   . No pertinent past medical history   . Seasonal allergies   . Breast cancer 11/07/11    left breast 1 o'clock bx=invasive ductal ca,lymphovascular invasion  ER/PR=positive,her 2 neu=neg  . Heart murmur     "nothing to worry about", childhood  . History of breast lump/mass excision 01/12/12    re-excision left breast  . Radiation 02/13/12-04/02/12    Left breast 60.4 gray    ALLERGIES:  is allergic to penicillins.  MEDICATIONS:  Current Outpatient Prescriptions  Medication Sig  Dispense Refill  . anastrozole (ARIMIDEX) 1 MG tablet Take 1 mg by mouth daily.      . Calcium-Vitamin D (CALTRATE 600 PLUS-VIT D PO) Take by mouth 2 (two) times daily.      . cholecalciferol (VITAMIN D) 1000 UNITS tablet Take 1,000 Units by mouth daily.      Marland Kitchen loratadine (CLARITIN) 10 MG tablet Take 10 mg by mouth daily.      . Multiple Vitamin (MULTIVITAMIN) tablet Take 1 tablet by mouth daily.         No current facility-administered medications for this visit.    SURGICAL HISTORY:  Past Surgical History  Procedure Laterality Date  . Myomectomy  1997  . Colonoscopy    . Breast surgery  11/25/11    Left Lumpectomy/lymph node,sentinel bx,l axillary=+ metastatic ductal ca  . Re-excision of breast cancer,superior margins  01/12/2012    Procedure: RE-EXCISION OF BREAST CANCER,SUPERIOR MARGINS;  Surgeon: Harl Bowie, MD;  Location: [place];  Service: General;  Laterality: Left;  re-excision left breast cancer   . Re-excision of breast cancer,superior margins  01/12/12    left    REVIEW OF SYSTEMS:  A comprehensive review of systems was negative.   HEALTH MAINTENANCE:  PHYSICAL EXAMINATION: Blood pressure 175/96, pulse 79, temperature 97.8 F (36.6 C), temperature source Oral, resp. rate 18, height _0  (1.702 m), weight 178 lb 6.4 oz (80.922 kg). Body mass index is 27.93 kg/(m^2). ECOG PERFORMANCE STATUS: 0 - Asymptomatic   General appearance:  alert, cooperative and appears stated age Neck: no adenopathy, no carotid bruit, no JVD, supple, symmetrical, trachea midline and thyroid not enlarged, symmetric, no tenderness/mass/nodules Lymph nodes: Cervical, supraclavicular, and axillary nodes normal. Resp: clear to auscultation bilaterally Back: symmetric, no curvature. ROM normal. No CVA tenderness. Cardio: regular rate and rhythm GI: soft, non-tender; bowel sounds normal; no masses,  no organomegaly Extremities: extremities normal, atraumatic, no cyanosis or edema Neurologic:  Grossly normal Breasts: breasts appear normal, no suspicious masses, no skin or nipple changes or axillary nodes.   LABORATORY DATA: Lab Results  Component Value Date   WBC 4.5 03/08/2013   HGB 13.3 03/08/2013   HCT 40.6 03/08/2013   MCV 83.0 03/08/2013   PLT 198 03/08/2013      Chemistry      Component Value Date/Time   NA 141 03/08/2013 1442   K 3.8 03/08/2013 1442   CL 104 07/26/2012 0948   CO2 26 03/08/2013 1442   BUN 10.9 03/08/2013 1442   CREATININE 0.8 03/08/2013 1442      Component Value Date/Time   CALCIUM 9.6 03/08/2013 1442   ALKPHOS 93 03/08/2013 1442   AST 24 03/08/2013 1442   ALT 30 03/08/2013 1442   BILITOT 0.59 03/08/2013 1442     ADDITIONAL INFORMATION: 1. A sample (Block 1B) was sent to [facility] for oncotype testing. The patient's recurrence score is 16. Those patients who had a recurrence score of 16 had an average rate of distant recurrence of 10%. (JBK:eps 12/20/11) Enid Cutter MD Pathologist, Electronic Signature ( Signed 12/20/2011) 1. CHROMOGENIC IN-SITU HYBRIDIZATION Interpretation HER-2/NEU BY CISH - NO AMPLIFICATION OF HER-2 DETECTED. THE RATIO OF HER-2: CEP 17 SIGNALS WAS 1.18. Reference range: Ratio: HER2:CEP17 < 1.8 - gene amplification not observed Ratio: HER2:CEP 17 1.8-2.2 - equivocal result Ratio: HER2:CEP17 > 2.2 - gene amplification observed Claudette Laws MD Pathologist, Electronic Signature ( Signed 12/02/2011) FINAL DIAGNOSIS Diagnosis 1. Breast, lumpectomy, Left - INVASIVE GRADE II DUCTAL CARCINOMA, SPANNING 1.5 CM. - ASSOCIATED INTERMEDIATE GRADE DUCTAL CARCINOMA IN SITU. 1 of 4 FINAL for [name], [name] ([id number]) Diagnosis(continued) - MEDIAL MARGIN IS BROADLY POSITIVE ON INITIAL LUMPECTOMY SPECIMEN. - TUMOR FOCALLY INVOLVES SUPERIOR MARGIN. - TUMOR IS <0.1 MM AWAY FROM ANTERIOR MARGIN. - OTHER MARGINS ARE NEGATIVE. - SEE ONCOLOGY TEMPLATE. 2. Breast, excision, Medial margin left - FIBROCYSTIC CHANGES WITH  CALCIFICATION. - NO ATYPIA, HYPERPLASIA OR MALIGNANCY IDENTIFIED. 3. Lymph node, sentinel, biopsy, Left axillary - ONE LYMPH NODE, POSITIVE FOR MICROMETASTATIC DUCTAL CARCINOMA (1/1). - SEE COMMENT. Microscopic Comment 1. BREAST, INVASIVE TUMOR, WITH LYMPH NODE SAMPLING Specimen, including laterality: Left partial breast with additional left medial margin and sentinel lymph node. Procedure: Left breast lumpectomy with additional medial margin excision and sentinel lymph node biopsy. Grade: II. Tubule formation: 3. Nuclear pleomorphism: 2. Mitotic: 1. Tumor size (gross measurement): 1.5 cm. Margins: Invasive, distance to closest margin: Medial margin on initial lumpectomy specimen is broadly positive; superior margin is focally positive and anterior margin is extremely close <0.1 mm; Of note, the additional medial left margin received is negative for carcinoma -- please correlate with operative impression for completeness of resection of the medial aspect. In-situ, distance to closest margin: At least 0.2 cm. Lymphovascular invasion: Yes, present. Ductal carcinoma in situ: Yes. Grade: Intermediate grade. Extensive intraductal component: No. Lobular neoplasia: No. Tumor focality: Unifocal. Treatment effect: Not applicable. Extent of tumor: Tumor confined to breast parenchyma. Lymph nodes: # examined: 1. Lymph nodes with metastasis: 1. Micrometastasis: (> 0.2 mm and <  2.0 mm): 1. Breast prognostic profile: Performed on previous case [id number]: Estrogen receptor: 100%, positive Progesterone receptor: 63%, positive Her 2 neu: 1.56, no amplification Ki-67: 62% Non-neoplastic breast: Incidental benign fibroadenoma, fibrocystic changes with calcifications. TNM: pT1c, pN27m, MX. Comments: A cytokeratin AE1/AE3 is performed on a single block of tumor to assess the margin status. A Her-2 neu by CISH will be repeated on the current tumor and reported in an addendum. 3. A  cytokeratin AE1/AE3 stain is performed which is positive and helps to highlight the extent of involvement. There is not a cluster of tumor cells present which is greater than 0.2 cm (2 mm). Therefore, the metastasis 2 of 4 FINAL for JCASSUNDRA, MCKEEVER(([id number] Microscopic Comment(continued) is best characterized as a micrometastasis. Dr. KLyndon Codehas seen the left axillary sentinel lymph node biopsy with a  RADIOGRAPHIC STUDIES:  No results found.  ASSESSMENT/PLAN: 58year old female with   1. Stage I IDC with 1 micromet to lymph node. S/p lumpectomy. Tumor ER positive, her2 negative. Margin was positive on final path. Patient's Oncotype testing score was in the low risk category. Therefore she went on to receive radiation therapy. She completed her radiation therapy from 02/14/2012 through 04/02/2012. She tolerated it well.  #2 patient is on adjuvant antiestrogen therapy consisting of Arimidex 1 mg daily. We discussed risks complications and benefits of the treatment. She understands she will take this daily for a total of 5 years.she has no evidence of recurrent disease  #3 patient will be seen back in 6 months time in followup  #4 we discussed breast cancer survivorship, we discussed exercise eating healthy and maintaining a good BMI   All questions were answered. The patient knows to call the clinic with any problems, questions or concerns. We can certainly see the patient much sooner if necessary.  I spent 20 minutes counseling the patient face to face. The total time spent in the appointment was 30 minutes.    KMarcy Panning MD Medical/[phone](beeper) [phone](Office)  03/08/2013, 3:19 PM

## 2013-05-27 ENCOUNTER — Encounter: Payer: Self-pay | Admitting: Gynecology

## 2013-05-27 ENCOUNTER — Other Ambulatory Visit (HOSPITAL_COMMUNITY)
Admission: RE | Admit: 2013-05-27 | Discharge: 2013-05-27 | Disposition: A | Discharge status: Home / Self Care | Payer: Managed Care, Other (non HMO) | Source: Ambulatory Visit | Attending: Gynecology | Admitting: Gynecology

## 2013-05-27 ENCOUNTER — Ambulatory Visit (INDEPENDENT_AMBULATORY_CARE_PROVIDER_SITE_OTHER): Payer: Managed Care, Other (non HMO) | Admitting: Gynecology

## 2013-05-27 VITALS — BP 136/84 | Ht 67.0 in | Wt 182.0 lb

## 2013-05-27 DIAGNOSIS — Z01419 Encounter for gynecological examination (general) (routine) without abnormal findings: Secondary | ICD-10-CM

## 2013-05-27 DIAGNOSIS — N951 Menopausal and female climacteric states: Secondary | ICD-10-CM

## 2013-05-27 DIAGNOSIS — Z1151 Encounter for screening for human papillomavirus (HPV): Secondary | ICD-10-CM | POA: Insufficient documentation

## 2013-05-27 NOTE — Progress Notes (Signed)
Katherine Coleman 09/27/1955 [id number]        58 y.o.  G1P1001 for annual exam.  Several issues noted below.  Past medical history,surgical history, problem list, medications, allergies, family history and social history were all reviewed and documented as reviewed in the EPIC chart.  ROS:  12 system ROS performed with pertinent positives and negatives included in the history, assessment and plan.  Included Systems: General, HEENT, Neck, Cardiovascular, Pulmonary, Gastrointestinal, Genitourinary, Musculoskeletal, Dermatologic, Endocrine, Hematological, Neurologic, Psychiatric Additional significant findings : None   Exam: Kim assistant Filed Vitals:   05/27/13 1554  BP: 136/84  Height: 5\' 7"  (1.702 m)  Weight: 182 lb (82.555 kg)   General appearance:  Normal affect, orientation and appearance. Skin: Grossly normal HEENT: Normal without gross oral lesions, cervical or supraclavicular adenopathy. Thyroid normal.  Lungs:  Clear without wheezing, rales or rhonchi Cardiac: RR, without RMG Abdominal:  Soft, nontender, without masses, guarding, rebound, organomegaly or hernia Breasts:  Examined lying and sitting without masses, retractions, discharge or axillary adenopathy. Well-healed left lumpectomy scar Pelvic:  Ext/BUS/vagina with generalized atrophic changes  Cervix atrophic. HPV done  Uterus axial to anteverted, normal size, shape and contour, midline and mobile nontender   Adnexa  Without masses or tenderness    Anus and perineum  Normal   Rectovaginal  Normal sphincter tone without palpated masses or tenderness.    Assessment/Plan:  58 y.o. G32P1001 female for annual exam.   1. Postmenopausal/atrophic genital changes. Is having some hot flashes since starting her anastrozole. Tolerating these well. Also having some vaginal dryness. Is not sexually active. No vaginal bleeding. Recommend trial of OTC moisturizers such as Replens. Call if any vaginal bleeding. 2. History of leiomyoma  status post myomectomy. Uterus is grossly normal on exam. We'll continue to monitor with annual exams. 3. Pap smear 2012. Pap/HPV today. No history of significant abnormal Pap smears previously. Plan repeat Pap smear at 3-5 year interval if this is negative. 4. Mammography 11/2012. Continue with annual mammography. Actively being followed by her oncologist for her breast cancer. SBE monthly reviewed. 5. DEXA 05/2012 normal. Ordered through her oncologist due to being on anastrozole. Followup with their recommended interval. Increase calcium vitamin D reviewed. 6. Colonoscopy 2010. She is calling her gastroenterologist to see when they want to repeat this. 7. Health maintenance. Blood pressure 136/84 mentioned to the patient. She notes it tends to go up when she sees doctors. We'll continue to monitor as an outpatient. Report any persistent elevations. Had normal comprehensive metabolic panel and CBC earlier this year. Glucose mildly elevated at 110 as a random specimen. Check glucose and lipid profile with urinalysis today. Followup in one year, sooner as needed.   Note: This document was prepared with digital dictation and possible smart phrase technology. Any transcriptional errors that result from this process are unintentional.   Anastasio Auerbach MD, 4:26 PM 05/27/2013

## 2013-05-27 NOTE — Patient Instructions (Signed)
try over-the-counter vaginal moisturizers such as Replens for vaginal dryness. Followup in one year for annual exam. Report any vaginal bleeding.

## 2013-05-28 ENCOUNTER — Other Ambulatory Visit: Payer: Self-pay | Admitting: Gynecology

## 2013-05-28 DIAGNOSIS — E7800 Pure hypercholesterolemia, unspecified: Secondary | ICD-10-CM

## 2013-05-28 LAB — URINALYSIS W MICROSCOPIC + REFLEX CULTURE
BACTERIA UA: NONE SEEN
Bilirubin Urine: NEGATIVE
CASTS: NONE SEEN
Crystals: NONE SEEN
Glucose, UA: NEGATIVE mg/dL
HGB URINE DIPSTICK: NEGATIVE
KETONES UR: NEGATIVE mg/dL
Leukocytes, UA: NEGATIVE
NITRITE: NEGATIVE
PH: 6 (ref 5.0–8.0)
Protein, ur: NEGATIVE mg/dL
Specific Gravity, Urine: <1.005 — ABNORMAL LOW (ref 1.005–1.030)
Squamous Epithelial / LPF: NONE SEEN
Urobilinogen, UA: 0.2 mg/dL (ref 0.0–1.0)

## 2013-05-28 LAB — LIPID PANEL
CHOL/HDL RATIO: 3.2 ratio
CHOLESTEROL: 226 mg/dL — AB (ref 0–200)
HDL: 70 mg/dL (ref >39)
LDL Cholesterol: 140 mg/dL — ABNORMAL HIGH (ref 0–99)
Triglycerides: 82 mg/dL (ref <150)
VLDL: 16 mg/dL (ref 0–40)

## 2013-05-28 LAB — GLUCOSE, RANDOM: Glucose, Bld: 92 mg/dL (ref 70–99)

## 2013-06-05 ENCOUNTER — Other Ambulatory Visit: Payer: Managed Care, Other (non HMO)

## 2013-06-05 DIAGNOSIS — E7800 Pure hypercholesterolemia, unspecified: Secondary | ICD-10-CM

## 2013-06-05 LAB — LIPID PANEL
CHOL/HDL RATIO: 2.7 ratio
CHOLESTEROL: 205 mg/dL — AB (ref 0–200)
HDL: 76 mg/dL (ref >39)
LDL Cholesterol: 113 mg/dL — ABNORMAL HIGH (ref 0–99)
TRIGLYCERIDES: 79 mg/dL (ref <150)
VLDL: 16 mg/dL (ref 0–40)

## 2013-09-09 ENCOUNTER — Other Ambulatory Visit: Payer: Managed Care, Other (non HMO)

## 2013-09-09 ENCOUNTER — Ambulatory Visit: Payer: Managed Care, Other (non HMO) | Admitting: Oncology

## 2013-10-03 ENCOUNTER — Other Ambulatory Visit: Payer: Self-pay | Admitting: Adult Health

## 2013-10-03 ENCOUNTER — Other Ambulatory Visit: Payer: Self-pay | Admitting: Gynecology

## 2013-10-03 DIAGNOSIS — Z853 Personal history of malignant neoplasm of breast: Secondary | ICD-10-CM

## 2013-10-31 ENCOUNTER — Other Ambulatory Visit: Payer: Self-pay | Admitting: *Deleted

## 2013-10-31 DIAGNOSIS — C50412 Malignant neoplasm of upper-outer quadrant of left female breast: Secondary | ICD-10-CM

## 2013-11-01 ENCOUNTER — Telehealth: Payer: Self-pay | Admitting: Hematology and Oncology

## 2013-11-01 ENCOUNTER — Ambulatory Visit (HOSPITAL_BASED_OUTPATIENT_CLINIC_OR_DEPARTMENT_OTHER): Payer: Managed Care, Other (non HMO) | Admitting: Hematology and Oncology

## 2013-11-01 ENCOUNTER — Other Ambulatory Visit (HOSPITAL_BASED_OUTPATIENT_CLINIC_OR_DEPARTMENT_OTHER): Payer: Managed Care, Other (non HMO)

## 2013-11-01 ENCOUNTER — Encounter: Payer: Self-pay | Admitting: Hematology and Oncology

## 2013-11-01 VITALS — BP 161/84 | HR 88 | Temp 97.8°F | Resp 18 | Ht 67.0 in | Wt 188.2 lb

## 2013-11-01 DIAGNOSIS — C50412 Malignant neoplasm of upper-outer quadrant of left female breast: Secondary | ICD-10-CM

## 2013-11-01 DIAGNOSIS — C50419 Malignant neoplasm of upper-outer quadrant of unspecified female breast: Secondary | ICD-10-CM

## 2013-11-01 DIAGNOSIS — Z170 Estrogen receptor positive status [ER+]: Secondary | ICD-10-CM

## 2013-11-01 LAB — COMPREHENSIVE METABOLIC PANEL (CC13)
ALBUMIN: 4.2 g/dL (ref 3.5–5.0)
ALK PHOS: 96 U/L (ref 40–150)
ALT: 18 U/L (ref 0–55)
AST: 24 U/L (ref 5–34)
Anion Gap: 9 mEq/L (ref 3–11)
BUN: 15.8 mg/dL (ref 7.0–26.0)
CO2: 30 mEq/L — ABNORMAL HIGH (ref 22–29)
Calcium: 10.1 mg/dL (ref 8.4–10.4)
Chloride: 103 mEq/L (ref 98–109)
Creatinine: 0.9 mg/dL (ref 0.6–1.1)
Glucose: 111 mg/dl (ref 70–140)
POTASSIUM: 3.7 meq/L (ref 3.5–5.1)
SODIUM: 142 meq/L (ref 136–145)
TOTAL PROTEIN: 7.6 g/dL (ref 6.4–8.3)
Total Bilirubin: 0.59 mg/dL (ref 0.20–1.20)

## 2013-11-01 LAB — CBC WITH DIFFERENTIAL/PLATELET
BASO%: 0.5 % (ref 0.0–2.0)
Basophils Absolute: 0 10*3/uL (ref 0.0–0.1)
EOS%: 2.1 % (ref 0.0–7.0)
Eosinophils Absolute: 0.1 10*3/uL (ref 0.0–0.5)
HCT: 39.3 % (ref 34.8–46.6)
HGB: 13.1 g/dL (ref 11.6–15.9)
LYMPH%: 37.5 % (ref 14.0–49.7)
MCH: 27.5 pg (ref 25.1–34.0)
MCHC: 33.3 g/dL (ref 31.5–36.0)
MCV: 82.4 fL (ref 79.5–101.0)
MONO#: 0.5 10*3/uL (ref 0.1–0.9)
MONO%: 11.3 % (ref 0.0–14.0)
NEUT%: 48.6 % (ref 38.4–76.8)
NEUTROS ABS: 2.1 10*3/uL (ref 1.5–6.5)
Platelets: 218 10*3/uL (ref 145–400)
RBC: 4.77 10*6/uL (ref 3.70–5.45)
RDW: 14.7 % — AB (ref 11.2–14.5)
WBC: 4.3 10*3/uL (ref 3.9–10.3)
lymph#: 1.6 10*3/uL (ref 0.9–3.3)

## 2013-11-01 NOTE — Assessment & Plan Note (Signed)
1. 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 Patient is tolerating Arimidex extremely well without any major problems or concerns. She gets GYN exams annually. Her last bone density test done in 2014 showed normal bones. I recommended that she get another bone density next year. She is scheduled to undergo mammograms next month. Today's breast exam does not reveal any major abnormalities. I recommended continued annual surveillance mammograms.  3. Return to clinic in 6 months for physical exam and followup. I reviewed her blood work which was normal. I recommended only once a year blood work on her.  Continuing to monitor for Arimidex-related toxicities.

## 2013-11-01 NOTE — Progress Notes (Signed)
Patient Care Team: [name], MD as PCP - General (Endocrinology)  DIAGNOSIS: Cancer of upper-outer quadrant of female breast   Primary site: Breast (Left)   Staging method: AJCC 7th Edition   Clinical: Stage IIA (T2, N0, cM0)   Summary: Stage IIA (T2, N0, cM0)   Clinical comments: Staged at breast conference 10.9.13   SUMMARY OF ONCOLOGIC HISTORY:   Cancer of upper-outer quadrant of female breast   11/10/2011 Initial Diagnosis Cancer of upper-outer quadrant of female breast: 1.8 cm left breast mass at 1:30 position IDC ER/PR positive HER-2 negative   11/25/2011 Surgery Left breast lumpectomy 1.5 cm grade 2 IDC with DCIS ER/PR positive HER-2 negative Ki-67 of 62%, 1 SLN positive for micrometastatic Oncotype DX recurrence score 16 (ROR 10%)   02/14/2012 - 04/02/2012 Radiation Therapy Radiation therapy to lumpectomy site   04/30/2012 -  Anti-estrogen oral therapy Arimidex 1 mg daily 5 years is the plan    CHIEF COMPLIANT: Six-month followup of history of breast cancer  INTERVAL HISTORY: [name] is a 58 year old American lady with above-mentioned history of left-sided breast cancer treated with lumpectomy and radiation followed by Arimidex therapy. She has been tolerating Arimidex very well without any major problems or concerns. She works for [employer] and stays very busy with her work with travel. But she is trying to lose weight and exercise frequently. She denies any new lumps or nodules.  REVIEW OF SYSTEMS:   Constitutional: Denies fevers, chills or abnormal weight loss Eyes: Denies blurriness of vision Ears, nose, mouth, throat, and face: Denies mucositis or sore throat Respiratory: Denies cough, dyspnea or wheezes Cardiovascular: Denies palpitation, chest discomfort or lower extremity swelling Gastrointestinal:  Denies nausea, heartburn or change in bowel habits Skin: Denies abnormal skin rashes Lymphatics: Denies new lymphadenopathy or easy bruising Neurological:Denies  numbness, tingling or new weaknesses Behavioral/Psych: Mood is stable, no new changes  Breast:  denies any pain or lumps or nodules in either breasts All other systems were reviewed with the patient and are negative.  I have reviewed the past medical history, past surgical history, social history and family history with the patient and they are unchanged from previous note.  ALLERGIES:  is allergic to penicillins.  MEDICATIONS:  Current Outpatient Prescriptions  Medication Sig Dispense Refill  . anastrozole (ARIMIDEX) 1 MG tablet Take 1 mg by mouth daily.      . Calcium-Vitamin D (CALTRATE 600 PLUS-VIT D PO) Take by mouth 2 (two) times daily.      . cholecalciferol (VITAMIN D) 1000 UNITS tablet Take 1,000 Units by mouth daily.      Marland Kitchen loratadine (CLARITIN) 10 MG tablet Take 10 mg by mouth daily.      . Multiple Vitamin (MULTIVITAMIN) tablet Take 1 tablet by mouth daily.         No current facility-administered medications for this visit.    PHYSICAL EXAMINATION: ECOG PERFORMANCE STATUS: 0 - Asymptomatic  Filed Vitals:   11/01/13 1125  BP: 161/84  Pulse: 88  Temp: 97.8 F (36.6 C)  Resp: 18   Filed Weights   11/01/13 1125  Weight: 188 lb 3.2 oz (85.367 kg)    GENERAL:alert, no distress and comfortable SKIN: skin color, texture, turgor are normal, no rashes or significant lesions EYES: normal, Conjunctiva are pink and non-injected, sclera clear OROPHARYNX:no exudate, no erythema and lips, buccal mucosa, and tongue normal  NECK: supple, thyroid normal size, non-tender, without nodularity LYMPH:  no palpable lymphadenopathy in the cervical, axillary or inguinal  LUNGS: clear to auscultation and percussion with normal breathing effort HEART: regular rate & rhythm and no murmurs and no lower extremity edema ABDOMEN:abdomen soft, non-tender and normal bowel sounds Musculoskeletal:no cyanosis of digits and no clubbing  NEURO: alert & oriented x 3 with fluent speech, no focal  motor/sensory deficits BREAST: No palpable masses or nodules in either right or left breasts left breast skin appears to be slightly more thicker than the right. This is due to prior radiation therapy. No palpable axillary supraclavicular or infraclavicular adenopathy no breast tenderness or nipple discharge.   LABORATORY DATA:  I have reviewed the data as listed   Chemistry      Component Value Date/Time   NA 142 11/01/2013 1113   K 3.7 11/01/2013 1113   CL 104 07/26/2012 0948   CO2 30* 11/01/2013 1113   BUN 15.8 11/01/2013 1113   CREATININE 0.9 11/01/2013 1113      Component Value Date/Time   CALCIUM 10.1 11/01/2013 1113   ALKPHOS 96 11/01/2013 1113   AST 24 11/01/2013 1113   ALT 18 11/01/2013 1113   BILITOT 0.59 11/01/2013 1113       Lab Results  Component Value Date   WBC 4.3 11/01/2013   HGB 13.1 11/01/2013   HCT 39.3 11/01/2013   MCV 82.4 11/01/2013   PLT 218 11/01/2013   NEUTROABS 2.1 11/01/2013     RADIOGRAPHIC STUDIES: No results found.   ASSESSMENT & PLAN:  Cancer of upper-outer quadrant of female breast 1. 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 Patient is tolerating Arimidex extremely well without any major problems or concerns. She gets GYN exams annually. Her last bone density test done in 2014 showed normal bones. I recommended that she get another bone density next year. She is scheduled to undergo mammograms next month. Today's breast exam does not reveal any major abnormalities. I recommended continued annual surveillance mammograms.  3. Return to clinic in 6 months for physical exam and followup. I reviewed her blood work which was normal. I recommended only once a year blood work on her.  Continuing to monitor for Arimidex-related toxicities.    Orders  Placed This Encounter  Procedures  . DG Bone Density    Standing Status: Future     Number of Occurrences:      Standing Expiration Date: 11/01/2014    Order Specific Question:  Reason for Exam (SYMPTOM  OR DIAGNOSIS REQUIRED)    Answer:  Left breast cancer on Arimidex therapy to evaluate for osteoporosis    Order Specific Question:  Is the patient pregnant?    Answer:  No    Order Specific Question:  Preferred imaging location?    Answer:  [facility]   The patient has a good understanding of the overall plan. she agrees with it. She will call with any problems that may develop before her next visit here.  I spent 25 minutes counseling the patient face to face. The total time spent in the appointment was 30 minutes and more than 50% was on counseling and review of test results    Gudena, Vinay K, MD 11/01/2013 12:13 PM   

## 2013-11-01 NOTE — Telephone Encounter (Signed)
lmonvm for pt re appt for march 2016. then called a 2nd time for appt for bone density @ [facility] april 2016. schedule mailed. per 9/25 pof f/u in 18mos per bone density order to be done april 2016.

## 2013-11-08 ENCOUNTER — Ambulatory Visit
Admission: RE | Admit: 2013-11-08 | Discharge: 2013-11-08 | Disposition: A | Discharge status: Home / Self Care | Payer: Managed Care, Other (non HMO) | Source: Ambulatory Visit | Attending: Gynecology | Admitting: Gynecology

## 2013-11-08 DIAGNOSIS — Z853 Personal history of malignant neoplasm of breast: Secondary | ICD-10-CM

## 2013-12-09 ENCOUNTER — Encounter: Payer: Self-pay | Admitting: Hematology and Oncology

## 2014-03-04 ENCOUNTER — Encounter: Payer: Self-pay | Admitting: Gynecology

## 2014-03-04 NOTE — Telephone Encounter (Signed)
Dr. Loetta Rough- Of course, we can help her getting her My Chart updated. She did have a  Question for you within the message though.

## 2014-03-04 NOTE — Telephone Encounter (Signed)
I would explain to her unfortunately Epic is still working out the bugs as far as updates with screening test reports. Without a family history of colon cancer or polyps or other abnormalities personally found on a prior colonoscopy then 10 year repeat interval is appropriate even with a history of breast cancer.

## 2014-04-02 ENCOUNTER — Other Ambulatory Visit: Payer: Self-pay | Admitting: Oncology

## 2014-04-02 NOTE — Telephone Encounter (Signed)
Next OV 05/01/14

## 2014-04-11 ENCOUNTER — Encounter: Payer: Self-pay | Admitting: Hematology and Oncology

## 2014-04-16 ENCOUNTER — Other Ambulatory Visit: Payer: Self-pay | Admitting: *Deleted

## 2014-04-16 DIAGNOSIS — C50419 Malignant neoplasm of upper-outer quadrant of unspecified female breast: Secondary | ICD-10-CM

## 2014-04-16 MED ORDER — ANASTROZOLE 1 MG PO TABS: 1.0000 mg | ORAL_TABLET | Freq: Every day | ORAL | Status: DC

## 2014-05-01 ENCOUNTER — Telehealth: Payer: Self-pay | Admitting: Hematology and Oncology

## 2014-05-01 ENCOUNTER — Ambulatory Visit (HOSPITAL_BASED_OUTPATIENT_CLINIC_OR_DEPARTMENT_OTHER): Payer: Managed Care, Other (non HMO) | Admitting: Hematology and Oncology

## 2014-05-01 VITALS — BP 177/87 | HR 69 | Temp 97.3°F | Resp 18 | Ht 67.0 in | Wt 186.8 lb

## 2014-05-01 DIAGNOSIS — Z170 Estrogen receptor positive status [ER+]: Secondary | ICD-10-CM

## 2014-05-01 DIAGNOSIS — C50412 Malignant neoplasm of upper-outer quadrant of left female breast: Secondary | ICD-10-CM | POA: Diagnosis not present

## 2014-05-01 NOTE — Telephone Encounter (Signed)
Called and left message with six month appointment     anne

## 2014-05-01 NOTE — Assessment & Plan Note (Signed)
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 She gets her annual GYN exams. Bone density has been scheduled for April.   Resident surveillance: 1.  Breast exam 05/01/2014 is normal 2.  Mammogram 10 2015 is normal   Return to clinic in 6 months with follow-up. Patient gets blood work with her OB/GYN. We'll follow up on those test results.

## 2014-05-01 NOTE — Progress Notes (Signed)
Patient Care Team: [name], MD as PCP - General (Endocrinology)  DIAGNOSIS: Primary cancer of upper outer quadrant of left female breast   Staging form: Breast, AJCC 7th Edition     Clinical: Stage IIA (T2, N0, cM0) - Unsigned       Staging comments: Staged at breast conference 10.9.13      Pathologic: No stage assigned - Unsigned   SUMMARY OF ONCOLOGIC HISTORY:   Primary cancer of upper outer quadrant of left female breast   11/10/2011 Initial Diagnosis Cancer of upper-outer quadrant of female breast: 1.8 cm left breast mass at 1:30 position IDC ER/PR positive HER-2 negative   11/25/2011 Surgery Left breast lumpectomy 1.5 cm grade 2 IDC with DCIS ER/PR positive HER-2 negative Ki-67 of 62%, 1 SLN positive for micrometastatic Oncotype DX recurrence score 16 (ROR 10%)   02/14/2012 - 04/02/2012 Radiation Therapy Radiation therapy to lumpectomy site   04/30/2012 -  Anti-estrogen oral therapy Arimidex 1 mg daily 5 years is the plan    CHIEF COMPLIANT:  Follow-up of breast cancer on Arimidex  INTERVAL HISTORY: Katherine Coleman is a  59 year old lady with above-mentioned history of left breast cancer treated with lumpectomy and radiation and is currently on Arimidex. She is tolerating it very well without any major problems or concerns. She is taking it for about 2 years. Denies any lumps or nodules in the breasts. She is staying very active working for [employer].  She is very excited about her son's management to be done next year. This will be done at [facility].   REVIEW OF SYSTEMS:   Constitutional: Denies fevers, chills or abnormal weight loss Eyes: Denies blurriness of vision Ears, nose, mouth, throat, and face: Denies mucositis or sore throat Respiratory: Denies cough, dyspnea or wheezes Cardiovascular: Denies palpitation, chest discomfort or lower extremity swelling Gastrointestinal:  Denies nausea, heartburn or change in bowel habits Skin: Denies abnormal skin rashes Lymphatics:  Denies new lymphadenopathy or easy bruising Neurological:Denies numbness, tingling or new weaknesses Behavioral/Psych: Mood is stable, no new changes  Breast:  denies any pain or lumps or nodules in either breasts All other systems were reviewed with the patient and are negative.  I have reviewed the past medical history, past surgical history, social history and family history with the patient and they are unchanged from previous note.  ALLERGIES:  is allergic to penicillins.  MEDICATIONS:  Current Outpatient Prescriptions  Medication Sig Dispense Refill  . anastrozole (ARIMIDEX) 1 MG tablet Take 1 tablet (1 mg total) by mouth daily. 90 tablet 3  . Calcium-Vitamin D (CALTRATE 600 PLUS-VIT D PO) Take by mouth 2 (two) times daily.    . cholecalciferol (VITAMIN D) 1000 UNITS tablet Take 1,000 Units by mouth daily.    Marland Kitchen loratadine (CLARITIN) 10 MG tablet Take 10 mg by mouth daily.    . Multiple Vitamin (MULTIVITAMIN) tablet Take 1 tablet by mouth daily.       No current facility-administered medications for this visit.    PHYSICAL EXAMINATION: ECOG PERFORMANCE STATUS: 0 - Asymptomatic  Filed Vitals:   05/01/14 1501  BP: 177/87  Pulse: 69  Temp: 97.3 F (36.3 C)  Resp: 18   Filed Weights   05/01/14 1501  Weight: 186 lb 12.8 oz (84.732 kg)    GENERAL:alert, no distress and comfortable SKIN: skin color, texture, turgor are normal, no rashes or significant lesions EYES: normal, Conjunctiva are pink and non-injected, sclera clear OROPHARYNX:no exudate, no erythema and lips, buccal mucosa, and  tongue normal  NECK: supple, thyroid normal size, non-tender, without nodularity LYMPH:  no palpable lymphadenopathy in the cervical, axillary or inguinal LUNGS: clear to auscultation and percussion with normal breathing effort HEART: regular rate & rhythm and no murmurs and no lower extremity edema ABDOMEN:abdomen soft, non-tender and normal bowel sounds Musculoskeletal:no cyanosis of  digits and no clubbing  NEURO: alert & oriented x 3 with fluent speech, no focal motor/sensory deficits BREAST: No palpable masses or nodules in either right or left breasts. No palpable axillary supraclavicular or infraclavicular adenopathy no breast tenderness or nipple discharge. (exam performed in the presence of a chaperone)  LABORATORY DATA:  I have reviewed the data as listed   Chemistry      Component Value Date/Time   NA 142 11/01/2013 1113   K 3.7 11/01/2013 1113   CL 104 07/26/2012 0948   CO2 30* 11/01/2013 1113   BUN 15.8 11/01/2013 1113   CREATININE 0.9 11/01/2013 1113      Component Value Date/Time   CALCIUM 10.1 11/01/2013 1113   ALKPHOS 96 11/01/2013 1113   AST 24 11/01/2013 1113   ALT 18 11/01/2013 1113   BILITOT 0.59 11/01/2013 1113       Lab Results  Component Value Date   WBC 4.3 11/01/2013   HGB 13.1 11/01/2013   HCT 39.3 11/01/2013   MCV 82.4 11/01/2013   PLT 218 11/01/2013   NEUTROABS 2.1 11/01/2013     RADIOGRAPHIC STUDIES: I have personally reviewed the radiology reports and agreed with their findings.  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 She gets her annual GYN exams. Bone density has been scheduled for April.   Resident surveillance: 1.  Breast exam 05/01/2014 is normal 2.  Mammogram 10 2015 is normal   Return to clinic in 6 months with follow-up. Patient gets blood work with her OB/GYN. We'll follow up on those test results.    No orders of the defined types were placed in this encounter.   The patient has a good understanding of the  overall plan. she agrees with it. She will call with any problems that may develop before her next visit here.   Rulon Eisenmenger, MD

## 2014-06-02 ENCOUNTER — Ambulatory Visit
Admission: RE | Admit: 2014-06-02 | Discharge: 2014-06-02 | Disposition: A | Discharge status: Home / Self Care | Payer: Managed Care, Other (non HMO) | Source: Ambulatory Visit | Attending: Hematology and Oncology | Admitting: Hematology and Oncology

## 2014-06-02 DIAGNOSIS — C50412 Malignant neoplasm of upper-outer quadrant of left female breast: Secondary | ICD-10-CM

## 2014-06-12 ENCOUNTER — Telehealth: Payer: Self-pay

## 2014-06-12 NOTE — Telephone Encounter (Signed)
Bone Desity results rcvd from [facility] 06/02/14.  Reviewed by Dr. Lindi Adie.  Sent to scan.

## 2014-06-13 ENCOUNTER — Encounter: Payer: Self-pay | Admitting: Gynecology

## 2014-06-13 ENCOUNTER — Ambulatory Visit (INDEPENDENT_AMBULATORY_CARE_PROVIDER_SITE_OTHER): Payer: Managed Care, Other (non HMO) | Admitting: Gynecology

## 2014-06-13 VITALS — BP 134/84 | Ht 67.0 in | Wt 187.0 lb

## 2014-06-13 DIAGNOSIS — Z01419 Encounter for gynecological examination (general) (routine) without abnormal findings: Secondary | ICD-10-CM | POA: Diagnosis not present

## 2014-06-13 DIAGNOSIS — N952 Postmenopausal atrophic vaginitis: Secondary | ICD-10-CM

## 2014-06-13 DIAGNOSIS — C50912 Malignant neoplasm of unspecified site of left female breast: Secondary | ICD-10-CM | POA: Diagnosis not present

## 2014-06-13 LAB — LIPID PANEL
CHOL/HDL RATIO: 2.5 ratio
Cholesterol: 188 mg/dL (ref 0–200)
HDL: 74 mg/dL (ref >=46)
LDL CALC: 98 mg/dL (ref 0–99)
Triglycerides: 82 mg/dL (ref <150)
VLDL: 16 mg/dL (ref 0–40)

## 2014-06-13 LAB — CBC WITH DIFFERENTIAL/PLATELET
Basophils Absolute: 0 10*3/uL (ref 0.0–0.1)
Basophils Relative: 1 % (ref 0–1)
Eosinophils Absolute: 0.1 10*3/uL (ref 0.0–0.7)
Eosinophils Relative: 2 % (ref 0–5)
HEMATOCRIT: 40.9 % (ref 36.0–46.0)
HEMOGLOBIN: 13.5 g/dL (ref 12.0–15.0)
Lymphocytes Relative: 45 % (ref 12–46)
Lymphs Abs: 2.1 10*3/uL (ref 0.7–4.0)
MCH: 27 pg (ref 26.0–34.0)
MCHC: 33 g/dL (ref 30.0–36.0)
MCV: 81.8 fL (ref 78.0–100.0)
MONO ABS: 0.4 10*3/uL (ref 0.1–1.0)
MONOS PCT: 9 % (ref 3–12)
MPV: 9.5 fL (ref 8.6–12.4)
Neutro Abs: 2 10*3/uL (ref 1.7–7.7)
Neutrophils Relative %: 43 % (ref 43–77)
Platelets: 235 10*3/uL (ref 150–400)
RBC: 5 MIL/uL (ref 3.87–5.11)
RDW: 15.3 % (ref 11.5–15.5)
WBC: 4.6 10*3/uL (ref 4.0–10.5)

## 2014-06-13 LAB — COMPREHENSIVE METABOLIC PANEL
ALK PHOS: 91 U/L (ref 39–117)
ALT: 19 U/L (ref 0–35)
AST: 22 U/L (ref 0–37)
Albumin: 4.6 g/dL (ref 3.5–5.2)
BUN: 18 mg/dL (ref 6–23)
CO2: 28 mEq/L (ref 19–32)
Calcium: 9.3 mg/dL (ref 8.4–10.5)
Chloride: 100 mEq/L (ref 96–112)
Creat: 0.87 mg/dL (ref 0.50–1.10)
GLUCOSE: 91 mg/dL (ref 70–99)
Potassium: 4 mEq/L (ref 3.5–5.3)
SODIUM: 137 meq/L (ref 135–145)
TOTAL PROTEIN: 7.4 g/dL (ref 6.0–8.3)
Total Bilirubin: 0.4 mg/dL (ref 0.2–1.2)

## 2014-06-13 NOTE — Patient Instructions (Signed)
You may obtain a copy of any labs that were done today by logging onto MyChart as outlined in the instructions provided with your AVS (after visit summary). The office will not call with normal lab results but certainly if there are any significant abnormalities then we will contact you.   Health Maintenance, Female A healthy lifestyle and preventative care can promote health and wellness.  Maintain regular health, dental, and eye exams.  Eat a healthy diet. Foods like vegetables, fruits, whole grains, low-fat dairy products, and lean protein foods contain the nutrients you need without too many calories. Decrease your intake of foods high in solid fats, added sugars, and salt. Get information about a proper diet from your caregiver, if necessary.  Regular physical exercise is one of the most important things you can do for your health. Most adults should get at least 150 minutes of moderate-intensity exercise (any activity that increases your heart rate and causes you to sweat) each week. In addition, most adults need muscle-strengthening exercises on 2 or more days a week.   Maintain a healthy weight. The body mass index (BMI) is a screening tool to identify possible weight problems. It provides an estimate of body fat based on height and weight. Your caregiver can help determine your BMI, and can help you achieve or maintain a healthy weight. For adults 20 years and older:  A BMI below 18.5 is considered underweight.  A BMI of 18.5 to 24.9 is normal.  A BMI of 25 to 29.9 is considered overweight.  A BMI of 30 and above is considered obese.  Maintain normal blood lipids and cholesterol by exercising and minimizing your intake of saturated fat. Eat a balanced diet with plenty of fruits and vegetables. Blood tests for lipids and cholesterol should begin at age 61 and be repeated every 5 years. If your lipid or cholesterol levels are high, you are over 50, or you are a high risk for heart  disease, you may need your cholesterol levels checked more frequently.Ongoing high lipid and cholesterol levels should be treated with medicines if diet and exercise are not effective.  If you smoke, find out from your caregiver how to quit. If you do not use tobacco, do not start.  Lung cancer screening is recommended for adults aged 33 80 years who are at high risk for developing lung cancer because of a history of smoking. Yearly low-dose computed tomography (CT) is recommended for people who have at least a 30-pack-year history of smoking and are a current smoker or have quit within the past 15 years. A pack year of smoking is smoking an average of 1 pack of cigarettes a day for 1 year (for example: 1 pack a day for 30 years or 2 packs a day for 15 years). Yearly screening should continue until the smoker has stopped smoking for at least 15 years. Yearly screening should also be stopped for people who develop a health problem that would prevent them from having lung cancer treatment.  If you are pregnant, do not drink alcohol. If you are breastfeeding, be very cautious about drinking alcohol. If you are not pregnant and choose to drink alcohol, do not exceed 1 drink per day. One drink is considered to be 12 ounces (355 mL) of beer, 5 ounces (148 mL) of wine, or 1.5 ounces (44 mL) of liquor.  Avoid use of street drugs. Do not share needles with anyone. Ask for help if you need support or instructions about stopping  the use of drugs.  High blood pressure causes heart disease and increases the risk of stroke. Blood pressure should be checked at least every 1 to 2 years. Ongoing high blood pressure should be treated with medicines, if weight loss and exercise are not effective.  If you are 59 to 58 years old, ask your caregiver if you should take aspirin to prevent strokes.  Diabetes screening involves taking a blood sample to check your fasting blood sugar level. This should be done once every 3  years, after age 91, if you are within normal weight and without risk factors for diabetes. Testing should be considered at a younger age or be carried out more frequently if you are overweight and have at least 1 risk factor for diabetes.  Breast cancer screening is essential preventative care for women. You should practice "breast self-awareness." This means understanding the normal appearance and feel of your breasts and may include breast self-examination. Any changes detected, no matter how small, should be reported to a caregiver. Women in their 66s and 30s should have a clinical breast exam (CBE) by a caregiver as part of a regular health exam every 1 to 3 years. After age 101, women should have a CBE every year. Starting at age 100, women should consider having a mammogram (breast X-ray) every year. Women who have a family history of breast cancer should talk to their caregiver about genetic screening. Women at a high risk of breast cancer should talk to their caregiver about having an MRI and a mammogram every year.  Breast cancer gene (BRCA)-related cancer risk assessment is recommended for women who have family members with BRCA-related cancers. BRCA-related cancers include breast, ovarian, tubal, and peritoneal cancers. Having family members with these cancers may be associated with an increased risk for harmful changes (mutations) in the breast cancer genes BRCA1 and BRCA2. Results of the assessment will determine the need for genetic counseling and BRCA1 and BRCA2 testing.  The Pap test is a screening test for cervical cancer. Women should have a Pap test starting at age 57. Between ages 25 and 35, Pap tests should be repeated every 2 years. Beginning at age 37, you should have a Pap test every 3 years as long as the past 3 Pap tests have been normal. If you had a hysterectomy for a problem that was not cancer or a condition that could lead to cancer, then you no longer need Pap tests. If you are  between ages 50 and 76, and you have had normal Pap tests going back 10 years, you no longer need Pap tests. If you have had past treatment for cervical cancer or a condition that could lead to cancer, you need Pap tests and screening for cancer for at least 20 years after your treatment. If Pap tests have been discontinued, risk factors (such as a new sexual partner) need to be reassessed to determine if screening should be resumed. Some women have medical problems that increase the chance of getting cervical cancer. In these cases, your caregiver may recommend more frequent screening and Pap tests.  The human papillomavirus (HPV) test is an additional test that may be used for cervical cancer screening. The HPV test looks for the virus that can cause the cell changes on the cervix. The cells collected during the Pap test can be tested for HPV. The HPV test could be used to screen women aged 44 years and older, and should be used in women of any age  who have unclear Pap test results. After the age of 55, women should have HPV testing at the same frequency as a Pap test.  Colorectal cancer can be detected and often prevented. Most routine colorectal cancer screening begins at the age of 44 and continues through age 20. However, your caregiver may recommend screening at an earlier age if you have risk factors for colon cancer. On a yearly basis, your caregiver may provide home test kits to check for hidden blood in the stool. Use of a small camera at the end of a tube, to directly examine the colon (sigmoidoscopy or colonoscopy), can detect the earliest forms of colorectal cancer. Talk to your caregiver about this at age 86, when routine screening begins. Direct examination of the colon should be repeated every 5 to 10 years through age 13, unless early forms of pre-cancerous polyps or small growths are found.  Hepatitis C blood testing is recommended for all people born from [birth year] through 1964 and any  individual with known risks for hepatitis C.  Practice safe sex. Use condoms and avoid high-risk sexual practices to reduce the spread of sexually transmitted infections (STIs). Sexually active women aged 36 and younger should be checked for Chlamydia, which is a common sexually transmitted infection. Older women with new or multiple partners should also be tested for Chlamydia. Testing for other STIs is recommended if you are sexually active and at increased risk.  Osteoporosis is a disease in which the bones lose minerals and strength with aging. This can result in serious bone fractures. The risk of osteoporosis can be identified using a bone density scan. Women ages 20 and over and women at risk for fractures or osteoporosis should discuss screening with their caregivers. Ask your caregiver whether you should be taking a calcium supplement or vitamin D to reduce the rate of osteoporosis.  Menopause can be associated with physical symptoms and risks. Hormone replacement therapy is available to decrease symptoms and risks. You should talk to your caregiver about whether hormone replacement therapy is right for you.  Use sunscreen. Apply sunscreen liberally and repeatedly throughout the day. You should seek shade when your shadow is shorter than you. Protect yourself by wearing long sleeves, pants, a wide-brimmed hat, and sunglasses year round, whenever you are outdoors.  Notify your caregiver of new moles or changes in moles, especially if there is a change in shape or color. Also notify your caregiver if a mole is larger than the size of a pencil eraser.  Stay current with your immunizations. Document Released: 08/09/2010 Document Revised: 05/21/2012 Document Reviewed: 08/09/2010 [facility] Patient Information 2014 [employer].

## 2014-06-13 NOTE — Progress Notes (Signed)
Katherine Coleman April 23, 1955 [id number]        58 y.o.  G1P1001 for annual exam.  Several issues noted below.  Past medical history,surgical history, problem list, medications, allergies, family history and social history were all reviewed and documented as reviewed in the EPIC chart.  ROS:  Performed with pertinent positives and negatives included in the history, assessment and plan.   Additional significant findings :  none   Exam: Kim Counsellor Vitals:   06/13/14 1456  BP: 134/84  Height: 5\' 7"  (1.702 m)  Weight: 187 lb (84.823 kg)   General appearance:  Normal affect, orientation and appearance. Skin: Grossly normal HEENT: Without gross lesions.  No cervical or supraclavicular adenopathy. Thyroid normal.  Lungs:  Clear without wheezing, rales or rhonchi Cardiac: RR, without RMG Abdominal:  Soft, nontender, without masses, guarding, rebound, organomegaly or hernia Breasts:  Examined lying and sitting without masses, retractions, discharge or axillary adenopathy. With well-healed left lumpectomy scar. Pelvic:  Ext/BUS/vagina with atrophic changes  Cervix with atrophic changes  Uterus axial to anteverted, normal size, shape and contour, midline and mobile nontender   Adnexa  Without masses or tenderness    Anus and perineum  Normal   Rectovaginal  Normal sphincter tone without palpated masses or tenderness.    Assessment/Plan:  60 y.o. G55P1001 female for annual exam.   1. Postmenopausal/atrophic genital changes. Without significant symptoms of hot flashes, night sweats, vaginal dryness. No vaginal bleeding. Continue to monitor. Report any vaginal bleeding. 2. History of breast cancer on arimidex doing well. Exam NED. Mammography 11/2013. Continue with annual mammography. Continue to follow up with her oncologist. SBE monthly reviewed. 3. Pap smear/HPV 05/2013. No Pap smear done today. Repeat at 3-5 year interval per current screening guidelines. 4. DEXA 2016 normal. Discussed  possible repeat at 2 year interval as she is on arimidex.  Oncologist will make that decision who ordered her last one. Increased calcium vitamin D reviewed. Check vitamin D level today. 5. Colonoscopy 2009. Reported repeat interval 10 years. 6. Health maintenance. Patient requests routine lab work done here. CBC, comprehensive metabolic panel, lipid profile, urinalysis, TSH and vitamin D ordered. Follow up in one year, sooner as needed.    Anastasio Auerbach MD, 3:48 PM 06/13/2014

## 2014-06-14 LAB — URINALYSIS W MICROSCOPIC + REFLEX CULTURE
Bacteria, UA: NONE SEEN
Bilirubin Urine: NEGATIVE
CASTS: NONE SEEN
CRYSTALS: NONE SEEN
GLUCOSE, UA: NEGATIVE mg/dL
Hgb urine dipstick: NEGATIVE
KETONES UR: NEGATIVE mg/dL
Nitrite: NEGATIVE
PH: 7 (ref 5.0–8.0)
Protein, ur: NEGATIVE mg/dL
SPECIFIC GRAVITY, URINE: 1.005 (ref 1.005–1.030)
Squamous Epithelial / LPF: NONE SEEN
UROBILINOGEN UA: 0.2 mg/dL (ref 0.0–1.0)

## 2014-06-14 LAB — VITAMIN D 25 HYDROXY (VIT D DEFICIENCY, FRACTURES): Vit D, 25-Hydroxy: 52 ng/mL (ref 30–100)

## 2014-06-14 LAB — TSH: TSH: 2.664 u[IU]/mL (ref 0.350–4.500)

## 2014-06-15 LAB — URINE CULTURE
Colony Count: NO GROWTH
Organism ID, Bacteria: NO GROWTH

## 2014-10-07 ENCOUNTER — Other Ambulatory Visit: Payer: Self-pay | Admitting: Gynecology

## 2014-10-07 ENCOUNTER — Other Ambulatory Visit: Payer: Self-pay

## 2014-10-07 DIAGNOSIS — Z9889 Other specified postprocedural states: Secondary | ICD-10-CM

## 2014-10-07 DIAGNOSIS — C50912 Malignant neoplasm of unspecified site of left female breast: Secondary | ICD-10-CM

## 2014-11-06 ENCOUNTER — Ambulatory Visit (HOSPITAL_BASED_OUTPATIENT_CLINIC_OR_DEPARTMENT_OTHER): Payer: Managed Care, Other (non HMO) | Admitting: Hematology and Oncology

## 2014-11-06 ENCOUNTER — Encounter: Payer: Self-pay | Admitting: Hematology and Oncology

## 2014-11-06 DIAGNOSIS — C50412 Malignant neoplasm of upper-outer quadrant of left female breast: Secondary | ICD-10-CM | POA: Diagnosis not present

## 2014-11-06 NOTE — Progress Notes (Signed)
Patient Care Team: [name], MD as PCP - General (Endocrinology)  DIAGNOSIS: Primary cancer of upper outer quadrant of left female breast   Staging form: Breast, AJCC 7th Edition     Clinical: Stage IIA (T2, N0, cM0) - Unsigned       Staging comments: Staged at breast conference 10.9.13      Pathologic: No stage assigned - Unsigned   SUMMARY OF ONCOLOGIC HISTORY:   Primary cancer of upper outer quadrant of left female breast   11/10/2011 Initial Diagnosis Cancer of upper-outer quadrant of female breast: 1.8 cm left breast mass at 1:30 position IDC ER/PR positive HER-2 negative   11/25/2011 Surgery Left breast lumpectomy 1.5 cm grade 2 IDC with DCIS ER/PR positive HER-2 negative Ki-67 of 62%, 1 SLN positive for micrometastatic Oncotype DX recurrence score 16 (ROR 10%)   02/14/2012 - 04/02/2012 Radiation Therapy Radiation therapy to lumpectomy site   04/30/2012 -  Anti-estrogen oral therapy Arimidex 1 mg daily 5 years is the plan    CHIEF COMPLIANT: Follow-up on Arimidex  INTERVAL HISTORY: Katherine Coleman is a 59 year old with above-mentioned history of left breast cancer treated with lumpectomy radiation and is currently on Arimidex therapy. She is tolerating it extremely well. She denies any hot flashes or myalgias. She is exercising 5 days a week at the gym. She denies any lumps or nodules in the breasts. Left breast that she had prior radiation therapy feels more dense than normal.  REVIEW OF SYSTEMS:   Constitutional: Denies fevers, chills or abnormal weight loss Eyes: Denies blurriness of vision Ears, nose, mouth, throat, and face: Denies mucositis or sore throat Respiratory: Denies cough, dyspnea or wheezes Cardiovascular: Denies palpitation, chest discomfort or lower extremity swelling Gastrointestinal:  Denies nausea, heartburn or change in bowel habits Skin: Denies abnormal skin rashes Lymphatics: Denies new lymphadenopathy or easy bruising Neurological:Denies numbness,  tingling or new weaknesses Behavioral/Psych: Mood is stable, no new changes  Breast:  denies any pain or lumps or nodules in either breasts All other systems were reviewed with the patient and are negative.  I have reviewed the past medical history, past surgical history, social history and family history with the patient and they are unchanged from previous note.  ALLERGIES:  is allergic to penicillins.  MEDICATIONS:  Current Outpatient Prescriptions  Medication Sig Dispense Refill  . anastrozole (ARIMIDEX) 1 MG tablet Take 1 tablet (1 mg total) by mouth daily. 90 tablet 3  . Calcium-Vitamin D (CALTRATE 600 PLUS-VIT D PO) Take by mouth 2 (two) times daily.    . cholecalciferol (VITAMIN D) 1000 UNITS tablet Take 1,000 Units by mouth daily.    Marland Kitchen loratadine (CLARITIN) 10 MG tablet Take 10 mg by mouth daily.    . Multiple Vitamin (MULTIVITAMIN) tablet Take 1 tablet by mouth daily.       No current facility-administered medications for this visit.    PHYSICAL EXAMINATION: ECOG PERFORMANCE STATUS: 0 - Asymptomatic  There were no vitals filed for this visit. There were no vitals filed for this visit.  GENERAL:alert, no distress and comfortable SKIN: skin color, texture, turgor are normal, no rashes or significant lesions EYES: normal, Conjunctiva are pink and non-injected, sclera clear OROPHARYNX:no exudate, no erythema and lips, buccal mucosa, and tongue normal  NECK: supple, thyroid normal size, non-tender, without nodularity LYMPH:  no palpable lymphadenopathy in the cervical, axillary or inguinal LUNGS: clear to auscultation and percussion with normal breathing effort HEART: regular rate & rhythm and no murmurs and no  lower extremity edema ABDOMEN:abdomen soft, non-tender and normal bowel sounds Musculoskeletal:no cyanosis of digits and no clubbing  NEURO: alert & oriented x 3 with fluent speech, no focal motor/sensory deficits BREAST: No palpable masses or nodules in either right  or left breasts. No palpable axillary supraclavicular or infraclavicular adenopathy no breast tenderness or nipple discharge. (exam performed in the presence of a chaperone)  LABORATORY DATA:  I have reviewed the data as listed   Chemistry      Component Value Date/Time   NA 137 06/13/2014 1523   NA 142 11/01/2013 1113   K 4.0 06/13/2014 1523   K 3.7 11/01/2013 1113   CL 100 06/13/2014 1523   CL 104 07/26/2012 0948   CO2 28 06/13/2014 1523   CO2 30* 11/01/2013 1113   BUN 18 06/13/2014 1523   BUN 15.8 11/01/2013 1113   CREATININE 0.87 06/13/2014 1523   CREATININE 0.9 11/01/2013 1113      Component Value Date/Time   CALCIUM 9.3 06/13/2014 1523   CALCIUM 10.1 11/01/2013 1113   ALKPHOS 91 06/13/2014 1523   ALKPHOS 96 11/01/2013 1113   AST 22 06/13/2014 1523   AST 24 11/01/2013 1113   ALT 19 06/13/2014 1523   ALT 18 11/01/2013 1113   BILITOT 0.4 06/13/2014 1523   BILITOT 0.59 11/01/2013 1113       Lab Results  Component Value Date   WBC 4.6 06/13/2014   HGB 13.5 06/13/2014   HCT 40.9 06/13/2014   MCV 81.8 06/13/2014   PLT 235 06/13/2014   NEUTROABS 2.0 06/13/2014   ASSESSMENT & PLAN:  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 She gets her annual GYN exams.  Bone density April 2016 T score 0.9 normal.  Breast Cancer Surveillance: 1. Breast exam 11/06/2014: Normal 2. Mammogram to be done 11/10/2014  Return to clinic in 6 months for follow-up and after that we will see her once a year.  No orders of the defined types were placed in this encounter.   The patient has a good understanding of the overall plan. she agrees with it.  she will call with any problems that may develop before the next visit here.   Rulon Eisenmenger, MD

## 2014-11-06 NOTE — Assessment & Plan Note (Signed)
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 She gets her annual GYN exams. Bone density has been scheduled for April.  Breast Cancer Surveillance: 1. Breast exam 11/06/2014: Normal 2. Mammogram to be done 11/10/2014  Return to clinic in 6 months for follow-up and after that we will see her once a year

## 2014-11-07 ENCOUNTER — Telehealth: Payer: Self-pay | Admitting: Hematology and Oncology

## 2014-11-07 NOTE — Telephone Encounter (Signed)
s.w. pt and advised on march 2017 appt.....pt ok and aware °

## 2014-11-10 ENCOUNTER — Ambulatory Visit
Admission: RE | Admit: 2014-11-10 | Discharge: 2014-11-10 | Disposition: A | Discharge status: Home / Self Care | Payer: Managed Care, Other (non HMO) | Source: Ambulatory Visit | Attending: Gynecology | Admitting: Gynecology

## 2014-11-10 DIAGNOSIS — Z9889 Other specified postprocedural states: Secondary | ICD-10-CM

## 2014-11-10 DIAGNOSIS — C50912 Malignant neoplasm of unspecified site of left female breast: Secondary | ICD-10-CM

## 2015-04-03 ENCOUNTER — Other Ambulatory Visit: Payer: Self-pay | Admitting: Hematology and Oncology

## 2015-04-20 ENCOUNTER — Telehealth: Payer: Self-pay | Admitting: Hematology and Oncology

## 2015-04-20 NOTE — Telephone Encounter (Signed)
Due to call day moved 3/28 f/u to 12 pm. Left message for patient and mailed schedule.

## 2015-05-05 ENCOUNTER — Encounter: Payer: Self-pay | Admitting: Hematology and Oncology

## 2015-05-05 ENCOUNTER — Ambulatory Visit (HOSPITAL_BASED_OUTPATIENT_CLINIC_OR_DEPARTMENT_OTHER): Payer: Managed Care, Other (non HMO) | Admitting: Hematology and Oncology

## 2015-05-05 VITALS — BP 125/60 | HR 76 | Temp 98.0°F | Resp 18 | Ht 67.0 in | Wt 193.6 lb

## 2015-05-05 DIAGNOSIS — Z170 Estrogen receptor positive status [ER+]: Secondary | ICD-10-CM

## 2015-05-05 DIAGNOSIS — C50412 Malignant neoplasm of upper-outer quadrant of left female breast: Secondary | ICD-10-CM

## 2015-05-05 NOTE — Assessment & Plan Note (Signed)
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 She gets her annual GYN exams.   Breast Cancer Surveillance: 1. Breast exam 05/05/2015: Normal 2. Mammogram10/04/2014: Normal Density cat B 3. Bone density 06/03/14: T score 0.5 (Normal)  Return to clinic in 1 year.

## 2015-05-05 NOTE — Progress Notes (Signed)
Unable to get in to exam room prior to MD.  No assessment performed.  

## 2015-05-05 NOTE — Progress Notes (Signed)
Patient Care Team: [name], MD as PCP - General (Endocrinology)  DIAGNOSIS: Primary cancer of upper outer quadrant of left female breast [facility])   Staging form: Breast, AJCC 7th Edition     Clinical: Stage IIA (T2, N0, cM0) - Unsigned       Staging comments: Staged at breast conference 10.9.13      Pathologic: No stage assigned - Unsigned  SUMMARY OF ONCOLOGIC HISTORY:   Primary cancer of upper outer quadrant of left female breast ([place])   11/10/2011 Initial Diagnosis Cancer of upper-outer quadrant of female breast: 1.8 cm left breast mass at 1:30 position IDC ER/PR positive HER-2 negative   11/25/2011 Surgery Left breast lumpectomy 1.5 cm grade 2 IDC with DCIS ER/PR positive HER-2 negative Ki-67 of 62%, 1 SLN positive for micrometastatic Oncotype DX recurrence score 16 (ROR 10%)   02/14/2012 - 04/02/2012 Radiation Therapy Radiation therapy to lumpectomy site   04/30/2012 -  Anti-estrogen oral therapy Arimidex 1 mg daily 5 years is the plan   CHIEF COMPLIANT: follow-up on Arimidex.  INTERVAL HISTORY: Katherine Coleman is a 60 year old with above-mentioned history left breast cancer currently on Arimidex therapy. She appears to be tolerating extremely well. She denies any hot flashes or myalgias. She is extremely physically active and athletic. She denies any lumps or nodules in the breast. She had excellent bone density previously.  REVIEW OF SYSTEMS:   Constitutional: Denies fevers, chills or abnormal weight loss Eyes: Denies blurriness of vision Ears, nose, mouth, throat, and face: Denies mucositis or sore throat Respiratory: Denies cough, dyspnea or wheezes Cardiovascular: Denies palpitation, chest discomfort Gastrointestinal:  Denies nausea, heartburn or change in bowel habits Skin: Denies abnormal skin rashes Lymphatics: Denies new lymphadenopathy or easy bruising Neurological:Denies numbness, tingling or new weaknesses Behavioral/Psych: Mood is stable, no new changes    Extremities: No lower extremity edema Breast:  denies any pain or lumps or nodules in either breasts All other systems were reviewed with the patient and are negative.  I have reviewed the past medical history, past surgical history, social history and family history with the patient and they are unchanged from previous note.  ALLERGIES:  is allergic to penicillins.  MEDICATIONS:  Current Outpatient Prescriptions  Medication Sig Dispense Refill  . anastrozole (ARIMIDEX) 1 MG tablet TAKE 1 TABLET (1 MG TOTAL) BY MOUTH DAILY. 90 tablet 3  . Calcium-Vitamin D (CALTRATE 600 PLUS-VIT D PO) Take by mouth 2 (two) times daily.    . cholecalciferol (VITAMIN D) 1000 UNITS tablet Take 1,000 Units by mouth daily.    Marland Kitchen loratadine (CLARITIN) 10 MG tablet Take 10 mg by mouth daily.    . Multiple Vitamin (MULTIVITAMIN) tablet Take 1 tablet by mouth daily.       No current facility-administered medications for this visit.    PHYSICAL EXAMINATION: ECOG PERFORMANCE STATUS: 0 - Asymptomatic  Filed Vitals:   05/05/15 1202  BP: 125/60  Pulse: 76  Temp: 98 F (36.7 C)  Resp: 18   Filed Weights   05/05/15 1202  Weight: 193 lb 9.6 oz (87.816 kg)    GENERAL:alert, no distress and comfortable SKIN: skin color, texture, turgor are normal, no rashes or significant lesions EYES: normal, Conjunctiva are pink and non-injected, sclera clear OROPHARYNX:no exudate, no erythema and lips, buccal mucosa, and tongue normal  NECK: supple, thyroid normal size, non-tender, without nodularity LYMPH:  no palpable lymphadenopathy in the cervical, axillary or inguinal LUNGS: clear to auscultation and percussion with normal breathing effort  HEART: regular rate & rhythm and no murmurs and no lower extremity edema ABDOMEN:abdomen soft, non-tender and normal bowel sounds MUSCULOSKELETAL:no cyanosis of digits and no clubbing  NEURO: alert & oriented x 3 with fluent speech, no focal motor/sensory deficits EXTREMITIES:  No lower extremity edema BREAST: No palpable masses or nodules in either right or left breasts. No palpable axillary supraclavicular or infraclavicular adenopathy no breast tenderness or nipple discharge. (exam performed in the presence of a chaperone)  LABORATORY DATA:  I have reviewed the data as listed   Chemistry      Component Value Date/Time   NA 137 06/13/2014 1523   NA 142 11/01/2013 1113   K 4.0 06/13/2014 1523   K 3.7 11/01/2013 1113   CL 100 06/13/2014 1523   CL 104 07/26/2012 0948   CO2 28 06/13/2014 1523   CO2 30* 11/01/2013 1113   BUN 18 06/13/2014 1523   BUN 15.8 11/01/2013 1113   CREATININE 0.87 06/13/2014 1523   CREATININE 0.9 11/01/2013 1113      Component Value Date/Time   CALCIUM 9.3 06/13/2014 1523   CALCIUM 10.1 11/01/2013 1113   ALKPHOS 91 06/13/2014 1523   ALKPHOS 96 11/01/2013 1113   AST 22 06/13/2014 1523   AST 24 11/01/2013 1113   ALT 19 06/13/2014 1523   ALT 18 11/01/2013 1113   BILITOT 0.4 06/13/2014 1523   BILITOT 0.59 11/01/2013 1113       Lab Results  Component Value Date   WBC 4.6 06/13/2014   HGB 13.5 06/13/2014   HCT 40.9 06/13/2014   MCV 81.8 06/13/2014   PLT 235 06/13/2014   NEUTROABS 2.0 06/13/2014   ASSESSMENT & PLAN:  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 She gets her annual GYN exams.   Breast Cancer Surveillance: 1. Breast exam 05/05/2015: Normal 2. Mammogram10/04/2014: Normal Density cat B 3. Bone density 06/03/14: T score 0.5 (Normal)  Her son is getting married in June at [place]. She is very excited about that. Return to clinic in 1 year.   No orders of the  defined types were placed in this encounter.   The patient has a good understanding of the overall plan. she agrees with it. she will call with any problems that may develop before the next visit here.   Rulon Eisenmenger, MD 05/05/2015

## 2015-05-06 ENCOUNTER — Telehealth: Payer: Self-pay | Admitting: Hematology and Oncology

## 2015-05-06 NOTE — Telephone Encounter (Signed)
Left message to inform patient of 1 yr fu appt in March 2018

## 2015-07-21 ENCOUNTER — Encounter: Payer: Self-pay | Admitting: Gynecology

## 2015-07-21 ENCOUNTER — Ambulatory Visit (INDEPENDENT_AMBULATORY_CARE_PROVIDER_SITE_OTHER): Payer: Managed Care, Other (non HMO) | Admitting: Gynecology

## 2015-07-21 VITALS — BP 134/80 | Ht 67.0 in | Wt 192.0 lb

## 2015-07-21 DIAGNOSIS — Z01419 Encounter for gynecological examination (general) (routine) without abnormal findings: Secondary | ICD-10-CM

## 2015-07-21 DIAGNOSIS — Z1321 Encounter for screening for nutritional disorder: Secondary | ICD-10-CM

## 2015-07-21 DIAGNOSIS — N952 Postmenopausal atrophic vaginitis: Secondary | ICD-10-CM

## 2015-07-21 DIAGNOSIS — Z1329 Encounter for screening for other suspected endocrine disorder: Secondary | ICD-10-CM

## 2015-07-21 DIAGNOSIS — Z13220 Encounter for screening for lipoid disorders: Secondary | ICD-10-CM

## 2015-07-21 LAB — COMPREHENSIVE METABOLIC PANEL
ALK PHOS: 87 U/L (ref 33–130)
ALT: 20 U/L (ref 6–29)
AST: 25 U/L (ref 10–35)
Albumin: 4.5 g/dL (ref 3.6–5.1)
BILIRUBIN TOTAL: 0.6 mg/dL (ref 0.2–1.2)
BUN: 13 mg/dL (ref 7–25)
CHLORIDE: 103 mmol/L (ref 98–110)
CO2: 26 mmol/L (ref 20–31)
Calcium: 9.3 mg/dL (ref 8.6–10.4)
Creat: 0.9 mg/dL (ref 0.50–1.05)
GLUCOSE: 150 mg/dL — AB (ref 65–99)
POTASSIUM: 3.4 mmol/L — AB (ref 3.5–5.3)
Sodium: 139 mmol/L (ref 135–146)
Total Protein: 6.8 g/dL (ref 6.1–8.1)

## 2015-07-21 LAB — CBC WITH DIFFERENTIAL/PLATELET
BASOS PCT: 0 %
Basophils Absolute: 0 cells/uL (ref 0–200)
EOS ABS: 144 {cells}/uL (ref 15–500)
Eosinophils Relative: 3 %
HCT: 39.9 % (ref 35.0–45.0)
Hemoglobin: 13.1 g/dL (ref 11.7–15.5)
LYMPHS PCT: 45 %
Lymphs Abs: 2160 cells/uL (ref 850–3900)
MCH: 27.3 pg (ref 27.0–33.0)
MCHC: 32.8 g/dL (ref 32.0–36.0)
MCV: 83.3 fL (ref 80.0–100.0)
MONOS PCT: 7 %
MPV: 9.7 fL (ref 7.5–12.5)
Monocytes Absolute: 336 cells/uL (ref 200–950)
Neutro Abs: 2160 cells/uL (ref 1500–7800)
Neutrophils Relative %: 45 %
PLATELETS: 205 10*3/uL (ref 140–400)
RBC: 4.79 MIL/uL (ref 3.80–5.10)
RDW: 15.4 % — AB (ref 11.0–15.0)
WBC: 4.8 10*3/uL (ref 3.8–10.8)

## 2015-07-21 LAB — LIPID PANEL
Cholesterol: 185 mg/dL (ref 125–200)
HDL: 80 mg/dL (ref >=46)
LDL CALC: 89 mg/dL (ref <130)
TRIGLYCERIDES: 79 mg/dL (ref <150)
Total CHOL/HDL Ratio: 2.3 Ratio (ref <=5.0)
VLDL: 16 mg/dL (ref <30)

## 2015-07-21 LAB — TSH: TSH: 2.28 m[IU]/L

## 2015-07-21 NOTE — Patient Instructions (Signed)

## 2015-07-21 NOTE — Progress Notes (Signed)
    CHAYAH PIGNONE 07/30/1955 [id number]        59 y.o.  G1P1001  for annual exam.  Doing well without complaints.  Past medical history,surgical history, problem list, medications, allergies, family history and social history were all reviewed and documented as reviewed in the EPIC chart.  ROS:  Performed with pertinent positives and negatives included in the history, assessment and plan.   Additional significant findings :  None   Exam: Caryn Bee assistant Filed Vitals:   07/21/15 1439  BP: 134/80  Height: 5\' 7"  (1.702 m)  Weight: 192 lb (87.091 kg)   General appearance:  Normal affect, orientation and appearance. Skin: Grossly normal HEENT: Without gross lesions.  No cervical or supraclavicular adenopathy. Thyroid normal.  Lungs:  Clear without wheezing, rales or rhonchi Cardiac: RR, without RMG Abdominal:  Soft, nontender, without masses, guarding, rebound, organomegaly or hernia Breasts:  Examined lying and sitting without masses, retractions, discharge or axillary adenopathy.  Well-healed left lumpectomy scar Pelvic:  Ext/BUS/vagina With atrophic changes  Cervix with atrophic changes  Uterus axial to anteverted, normal size, shape and contour, midline and mobile nontender   Adnexa without masses or tenderness    Anus and perineum normal   Rectovaginal normal sphincter tone without palpated masses or tenderness.    Assessment/Plan:  60 y.o. G60P1001 female for annual exam.   1. Postmenopausal/atrophic genital changes. Without significant hot flashes, night sweats, vaginal dryness or any vaginal bleeding. Continue to monitor and report any issues or vaginal bleeding. 2. History of left breast cancer, continues on Arimidex. Exam NED. Mammography 11/2014. Continue to follow up with oncology. 3. Pap smear/HPV 05/2013. No Pap smear done today. No history of significant abnormal Pap smears previously. 4. DEXA 2016 normal. Plan repeat next year at two-year interval as she  continues on Arimidex. Check vitamin D level today. 5. Colonoscopy 2009. Repeat at their recommended interval. 6. Health maintenance. Patient requests baseline labs. CBC, CMP, lipid profile, TSH, vitamin D, urinalysis ordered. Follow up 1 year, sooner as needed.   Anastasio Auerbach MD, 3:13 PM 07/21/2015

## 2015-07-22 LAB — URINALYSIS W MICROSCOPIC + REFLEX CULTURE
Bacteria, UA: NONE SEEN [HPF]
Bilirubin Urine: NEGATIVE
CASTS: NONE SEEN [LPF]
CRYSTALS: NONE SEEN [HPF]
Glucose, UA: NEGATIVE
Hgb urine dipstick: NEGATIVE
KETONES UR: NEGATIVE
NITRITE: NEGATIVE
PH: 7 (ref 5.0–8.0)
Protein, ur: NEGATIVE
SPECIFIC GRAVITY, URINE: 1.015 (ref 1.001–1.035)
Squamous Epithelial / LPF: NONE SEEN [HPF] (ref <=5)
YEAST: NONE SEEN [HPF]

## 2015-07-22 LAB — VITAMIN D 25 HYDROXY (VIT D DEFICIENCY, FRACTURES): Vit D, 25-Hydroxy: 56 ng/mL (ref 30–100)

## 2015-07-23 LAB — URINE CULTURE: Colony Count: 50000

## 2015-07-28 ENCOUNTER — Other Ambulatory Visit: Payer: Self-pay | Admitting: Gynecology

## 2015-07-28 DIAGNOSIS — R7309 Other abnormal glucose: Secondary | ICD-10-CM

## 2015-07-28 DIAGNOSIS — E876 Hypokalemia: Secondary | ICD-10-CM

## 2015-08-04 ENCOUNTER — Other Ambulatory Visit: Payer: Managed Care, Other (non HMO)

## 2015-08-04 DIAGNOSIS — R7309 Other abnormal glucose: Secondary | ICD-10-CM

## 2015-08-04 DIAGNOSIS — E876 Hypokalemia: Secondary | ICD-10-CM

## 2015-08-04 LAB — COMPREHENSIVE METABOLIC PANEL
ALK PHOS: 89 U/L (ref 33–130)
ALT: 17 U/L (ref 6–29)
AST: 20 U/L (ref 10–35)
Albumin: 4.3 g/dL (ref 3.6–5.1)
BILIRUBIN TOTAL: 0.6 mg/dL (ref 0.2–1.2)
BUN: 18 mg/dL (ref 7–25)
CALCIUM: 9.2 mg/dL (ref 8.6–10.4)
CO2: 25 mmol/L (ref 20–31)
CREATININE: 0.88 mg/dL (ref 0.50–1.05)
Chloride: 103 mmol/L (ref 98–110)
GLUCOSE: 97 mg/dL (ref 65–99)
Potassium: 4.3 mmol/L (ref 3.5–5.3)
SODIUM: 140 mmol/L (ref 135–146)
Total Protein: 6.8 g/dL (ref 6.1–8.1)

## 2015-08-04 LAB — HEMOGLOBIN A1C
Hgb A1c MFr Bld: 6.1 % — ABNORMAL HIGH (ref <5.7)
Mean Plasma Glucose: 128 mg/dL

## 2015-10-13 ENCOUNTER — Other Ambulatory Visit: Payer: Self-pay | Admitting: Gynecology

## 2015-10-13 DIAGNOSIS — Z853 Personal history of malignant neoplasm of breast: Secondary | ICD-10-CM

## 2015-11-13 ENCOUNTER — Ambulatory Visit
Admission: RE | Admit: 2015-11-13 | Discharge: 2015-11-13 | Disposition: A | Discharge status: Home / Self Care | Payer: Managed Care, Other (non HMO) | Source: Ambulatory Visit | Attending: Gynecology | Admitting: Gynecology

## 2015-11-13 DIAGNOSIS — Z853 Personal history of malignant neoplasm of breast: Secondary | ICD-10-CM

## 2016-03-28 ENCOUNTER — Encounter: Payer: Self-pay | Admitting: Hematology and Oncology

## 2016-03-29 ENCOUNTER — Other Ambulatory Visit: Payer: Self-pay

## 2016-03-29 MED ORDER — ANASTROZOLE 1 MG PO TABS: ORAL_TABLET | ORAL | 3 refills | Status: DC

## 2016-03-31 ENCOUNTER — Other Ambulatory Visit: Payer: Self-pay | Admitting: *Deleted

## 2016-05-05 ENCOUNTER — Ambulatory Visit (HOSPITAL_BASED_OUTPATIENT_CLINIC_OR_DEPARTMENT_OTHER): Payer: 59 | Admitting: Hematology and Oncology

## 2016-05-05 ENCOUNTER — Encounter: Payer: Self-pay | Admitting: Hematology and Oncology

## 2016-05-05 DIAGNOSIS — C50412 Malignant neoplasm of upper-outer quadrant of left female breast: Secondary | ICD-10-CM | POA: Diagnosis not present

## 2016-05-05 DIAGNOSIS — Z79811 Long term (current) use of aromatase inhibitors: Secondary | ICD-10-CM | POA: Diagnosis not present

## 2016-05-05 DIAGNOSIS — Z170 Estrogen receptor positive status [ER+]: Secondary | ICD-10-CM | POA: Diagnosis not present

## 2016-05-05 NOTE — Assessment & Plan Note (Signed)
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 She gets her annual GYN exams.   Breast Cancer Surveillance: 1. Breast exam 05/04/2016: Normal 2. Mammogram 11/13/15: Normal Density cat B 3. Bone density 06/03/14: T score 0.5 (Normal)  Her son got married in June at [place].  Return to clinic in 1 year.

## 2016-05-05 NOTE — Progress Notes (Signed)
Patient Care Team: [name], MD as PCP - General (Endocrinology)  DIAGNOSIS:  Encounter Diagnosis  Name Primary?  . Primary cancer of upper outer quadrant of left female breast ([name])     SUMMARY OF ONCOLOGIC HISTORY:   Primary cancer of upper outer quadrant of left female breast ([name])   11/10/2011 Initial Diagnosis    Cancer of upper-outer quadrant of female breast: 1.8 cm left breast mass at 1:30 position IDC ER/PR positive HER-2 negative      11/25/2011 Surgery    Left breast lumpectomy 1.5 cm grade 2 IDC with DCIS ER/PR positive HER-2 negative Ki-67 of 62%, 1 SLN positive for micrometastatic Oncotype DX recurrence score 16 (ROR 10%)      02/14/2012 - 04/02/2012 Radiation Therapy    Radiation therapy to lumpectomy site      04/30/2012 -  Anti-estrogen oral therapy    Arimidex 1 mg daily 5 years is the plan       CHIEF COMPLIANT: Follow-up on Arimidex therapy  INTERVAL HISTORY: Katherine Coleman is a 61 year old with above-mentioned history of stage I left breast cancer currently on Arimidex adjuvant therapy and is tolerating it extremely well. She denies any hot flashes or myalgias. Denies any lumps or nodules in the breast. Her blood pressure today was limited elevated and she was very concerned about it.  REVIEW OF SYSTEMS:   Constitutional: Denies fevers, chills or abnormal weight loss Eyes: Denies blurriness of vision Ears, nose, mouth, throat, and face: Denies mucositis or sore throat Respiratory: Denies cough, dyspnea or wheezes Cardiovascular: Denies palpitation, chest discomfort Gastrointestinal:  Denies nausea, heartburn or change in bowel habits Skin: Denies abnormal skin rashes Lymphatics: Denies new lymphadenopathy or easy bruising Neurological:Denies numbness, tingling or new weaknesses Behavioral/Psych: Mood is stable, no new changes  Extremities: No lower extremity edema Breast:  denies any pain or lumps or nodules in either breasts All other systems  were reviewed with the patient and are negative.  I have reviewed the past medical history, past surgical history, social history and family history with the patient and they are unchanged from previous note.  ALLERGIES:  is allergic to penicillins.  MEDICATIONS:  Current Outpatient Prescriptions  Medication Sig Dispense Refill  . anastrozole (ARIMIDEX) 1 MG tablet TAKE 1 TABLET (1 MG TOTAL) BY MOUTH DAILY. 90 tablet 3  . Calcium-Vitamin D (CALTRATE 600 PLUS-VIT D PO) Take by mouth 2 (two) times daily.    . cholecalciferol (VITAMIN D) 1000 UNITS tablet Take 1,000 Units by mouth daily.    Marland Kitchen loratadine (CLARITIN) 10 MG tablet Take 10 mg by mouth daily.    . Multiple Vitamin (MULTIVITAMIN) tablet Take 1 tablet by mouth daily.       No current facility-administered medications for this visit.     PHYSICAL EXAMINATION: ECOG PERFORMANCE STATUS: 0 - Asymptomatic  Vitals:   05/05/16 1132  BP: (!) 164/85  Pulse: 76  Resp: 18  Temp: 98.5 F (36.9 C)   Filed Weights   05/05/16 1132  Weight: 200 lb 3.2 oz (90.8 kg)    GENERAL:alert, no distress and comfortable SKIN: skin color, texture, turgor are normal, no rashes or significant lesions EYES: normal, Conjunctiva are pink and non-injected, sclera clear OROPHARYNX:no exudate, no erythema and lips, buccal mucosa, and tongue normal  NECK: supple, thyroid normal size, non-tender, without nodularity LYMPH:  no palpable lymphadenopathy in the cervical, axillary or inguinal LUNGS: clear to auscultation and percussion with normal breathing effort HEART: regular rate &  rhythm and no murmurs and no lower extremity edema ABDOMEN:abdomen soft, non-tender and normal bowel sounds MUSCULOSKELETAL:no cyanosis of digits and no clubbing  NEURO: alert & oriented x 3 with fluent speech, no focal motor/sensory deficits EXTREMITIES: No lower extremity edema BREAST: No palpable masses or nodules in either right or left breasts. The left breast has scar  tissue and feels form around the areas of surgery when she had radiation. No palpable axillary supraclavicular or infraclavicular adenopathy no breast tenderness or nipple discharge. (exam performed in the presence of a chaperone)  LABORATORY DATA:  I have reviewed the data as listed   Chemistry      Component Value Date/Time   NA 140 08/04/2015 0827   NA 142 11/01/2013 1113   K 4.3 08/04/2015 0827   K 3.7 11/01/2013 1113   CL 103 08/04/2015 0827   CL 104 07/26/2012 0948   CO2 25 08/04/2015 0827   CO2 30 (H) 11/01/2013 1113   BUN 18 08/04/2015 0827   BUN 15.8 11/01/2013 1113   CREATININE 0.88 08/04/2015 0827   CREATININE 0.9 11/01/2013 1113      Component Value Date/Time   CALCIUM 9.2 08/04/2015 0827   CALCIUM 10.1 11/01/2013 1113   ALKPHOS 89 08/04/2015 0827   ALKPHOS 96 11/01/2013 1113   AST 20 08/04/2015 0827   AST 24 11/01/2013 1113   ALT 17 08/04/2015 0827   ALT 18 11/01/2013 1113   BILITOT 0.6 08/04/2015 0827   BILITOT 0.59 11/01/2013 1113       Lab Results  Component Value Date   WBC 4.8 07/21/2015   HGB 13.1 07/21/2015   HCT 39.9 07/21/2015   MCV 83.3 07/21/2015   PLT 205 07/21/2015   NEUTROABS 2,160 07/21/2015    ASSESSMENT & PLAN:  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 She gets her annual GYN exams.  We discussed the duration of Arimidex therapy. She is tolerating it so well that we may decide to continue it beyond 5 years up to 7 years. She'll make a decision next year when she comes back to see [place].  Breast Cancer Surveillance: 1. Breast exam 05/04/2016: Normal 2. Mammogram 11/13/15: Normal Density cat  B 3. Bone density 06/03/14: T score 0.5 (Normal)  Return to clinic in 1 year.   I spent 15 minutes talking to the patient of which more than half was spent in counseling and coordination of care.  No orders of the defined types were placed in this encounter.  The patient has a good understanding of the overall plan. she agrees with it. she will call with any problems that may develop before the next visit here.   Rulon Eisenmenger, MD 05/05/16

## 2016-07-27 ENCOUNTER — Encounter: Payer: Self-pay | Admitting: Gynecology

## 2016-07-27 ENCOUNTER — Ambulatory Visit (INDEPENDENT_AMBULATORY_CARE_PROVIDER_SITE_OTHER): Payer: 59 | Admitting: Gynecology

## 2016-07-27 VITALS — BP 122/80 | Ht 67.0 in | Wt 199.0 lb

## 2016-07-27 DIAGNOSIS — Z1329 Encounter for screening for other suspected endocrine disorder: Secondary | ICD-10-CM

## 2016-07-27 DIAGNOSIS — Z13220 Encounter for screening for lipoid disorders: Secondary | ICD-10-CM

## 2016-07-27 DIAGNOSIS — Z01411 Encounter for gynecological examination (general) (routine) with abnormal findings: Secondary | ICD-10-CM | POA: Diagnosis not present

## 2016-07-27 DIAGNOSIS — N952 Postmenopausal atrophic vaginitis: Secondary | ICD-10-CM

## 2016-07-27 LAB — LIPID PANEL
Cholesterol: 201 mg/dL — ABNORMAL HIGH (ref <200)
HDL: 63 mg/dL (ref >50)
LDL CALC: 118 mg/dL — AB (ref <100)
TRIGLYCERIDES: 99 mg/dL (ref <150)
Total CHOL/HDL Ratio: 3.2 Ratio (ref <5.0)
VLDL: 20 mg/dL (ref <30)

## 2016-07-27 LAB — CBC WITH DIFFERENTIAL/PLATELET
BASOS ABS: 0 {cells}/uL (ref 0–200)
Basophils Relative: 0 %
EOS ABS: 112 {cells}/uL (ref 15–500)
Eosinophils Relative: 2 %
HCT: 41.3 % (ref 35.0–45.0)
HEMOGLOBIN: 13.8 g/dL (ref 11.7–15.5)
LYMPHS ABS: 2576 {cells}/uL (ref 850–3900)
Lymphocytes Relative: 46 %
MCH: 27.5 pg (ref 27.0–33.0)
MCHC: 33.4 g/dL (ref 32.0–36.0)
MCV: 82.4 fL (ref 80.0–100.0)
MONOS PCT: 9 %
MPV: 9.8 fL (ref 7.5–12.5)
Monocytes Absolute: 504 cells/uL (ref 200–950)
NEUTROS ABS: 2408 {cells}/uL (ref 1500–7800)
Neutrophils Relative %: 43 %
Platelets: 252 10*3/uL (ref 140–400)
RBC: 5.01 MIL/uL (ref 3.80–5.10)
RDW: 15.4 % — ABNORMAL HIGH (ref 11.0–15.0)
WBC: 5.6 10*3/uL (ref 3.8–10.8)

## 2016-07-27 LAB — URINALYSIS W MICROSCOPIC + REFLEX CULTURE
BACTERIA UA: NONE SEEN [HPF]
Bilirubin Urine: NEGATIVE
CASTS: NONE SEEN [LPF]
CRYSTALS: NONE SEEN [HPF]
Glucose, UA: NEGATIVE
Hgb urine dipstick: NEGATIVE
KETONES UR: NEGATIVE
Nitrite: NEGATIVE
PROTEIN: NEGATIVE
RBC / HPF: NONE SEEN RBC/HPF (ref <=2)
Specific Gravity, Urine: 1.008 (ref 1.001–1.035)
Yeast: NONE SEEN [HPF]
pH: 7 (ref 5.0–8.0)

## 2016-07-27 LAB — COMPREHENSIVE METABOLIC PANEL
ALK PHOS: 95 U/L (ref 33–130)
ALT: 22 U/L (ref 6–29)
AST: 20 U/L (ref 10–35)
Albumin: 4.7 g/dL (ref 3.6–5.1)
BILIRUBIN TOTAL: 0.7 mg/dL (ref 0.2–1.2)
BUN: 10 mg/dL (ref 7–25)
CALCIUM: 9.5 mg/dL (ref 8.6–10.4)
CO2: 29 mmol/L (ref 20–31)
Chloride: 100 mmol/L (ref 98–110)
Creat: 0.87 mg/dL (ref 0.50–1.05)
GLUCOSE: 90 mg/dL (ref 65–99)
POTASSIUM: 3.9 mmol/L (ref 3.5–5.3)
Sodium: 138 mmol/L (ref 135–146)
Total Protein: 7.5 g/dL (ref 6.1–8.1)

## 2016-07-27 LAB — TSH: TSH: 2.61 mIU/L

## 2016-07-27 NOTE — Patient Instructions (Signed)
Followup in one year for annual exam, sooner as needed. 

## 2016-07-27 NOTE — Progress Notes (Signed)
    Katherine Coleman 1955-03-24 [id number]        61 y.o.  G1P1001 for annual exam.  Doing well without complaints.  Past medical history,surgical history, problem list, medications, allergies, family history and social history were all reviewed and documented as reviewed in the EPIC chart.  ROS:  Performed with pertinent positives and negatives included in the history, assessment and plan.   Additional significant findings :  None   Exam: Caryn Bee assistant Vitals:   07/27/16 1130  BP: 122/80  Weight: 199 lb (90.3 kg)  Height: 5\' 7"  (1.702 m)   Body mass index is 31.17 kg/m.  General appearance:  Normal affect, orientation and appearance. Skin: Grossly normal HEENT: Without gross lesions.  No cervical or supraclavicular adenopathy. Thyroid normal.  Lungs:  Clear without wheezing, rales or rhonchi Cardiac: RR, with 1 to 2/6 systolic ejection murmur. Abdominal:  Soft, nontender, without masses, guarding, rebound, organomegaly or hernia Breasts:  Examined lying and sitting without masses, retractions, discharge or axillary adenopathy.  Well-healed left lumpectomy scar Pelvic:  Ext, BUS, Vagina: With atrophic changes  Cervix: With atrophic changes  Uterus: Anteverted, normal size, shape and contour, midline and mobile nontender   Adnexa: Without masses or tenderness    Anus and perineum: Normal   Rectovaginal: Normal sphincter tone without palpated masses or tenderness.    Assessment/Plan:  61 y.o. G91P1001 female for annual exam.   1. Postmenopausal/atrophic genital changes. No significant hot flushes, night sweats, vaginal dryness or any vaginal bleeding. Continue to monitor and report any issues or bleeding. 2. History of left breast cancer. Continues on Arimidex. Exam NED. Mammography 11/2015. Continue with annual mammography and follow up with oncology. 3. Heart murmur. I had not appreciated this previously. Patient relates that this has been evaluated in the past and no  further workup was required. 4. DEXA 2016 normal. Will discuss with her oncologist when to repeat this given that she continues on Arimidex. She agrees to bring up the subject when she sees him next time. 5. Colonoscopy 2009 with reported repeat interval 10 years. 6. Pap smear/HPV 2015. No Pap smear done today. No history of abnormal Pap smears. Plan repeat Pap smear at 5 year interval per current screening guidelines. 7. Health maintenance. Patient requests baseline labs. CBC, CMP, lipid profile, TSH, urinalysis ordered. Follow up 1 year, sooner as needed. Vitamin D 56 last year.   Anastasio Auerbach MD, 11:45 AM 07/27/2016

## 2016-07-28 LAB — URINE CULTURE

## 2016-10-18 ENCOUNTER — Other Ambulatory Visit: Payer: Self-pay | Admitting: Hematology and Oncology

## 2016-10-18 DIAGNOSIS — Z853 Personal history of malignant neoplasm of breast: Secondary | ICD-10-CM

## 2016-11-15 ENCOUNTER — Ambulatory Visit
Admission: RE | Admit: 2016-11-15 | Discharge: 2016-11-15 | Disposition: A | Discharge status: Home / Self Care | Payer: 59 | Source: Ambulatory Visit | Attending: Hematology and Oncology | Admitting: Hematology and Oncology

## 2016-11-15 DIAGNOSIS — Z853 Personal history of malignant neoplasm of breast: Secondary | ICD-10-CM

## 2016-11-15 HISTORY — DX: Personal history of irradiation: Z92.3

## 2017-03-08 ENCOUNTER — Encounter: Payer: Self-pay | Admitting: Hematology and Oncology

## 2017-03-08 ENCOUNTER — Other Ambulatory Visit: Payer: Self-pay

## 2017-03-08 MED ORDER — ANASTROZOLE 1 MG PO TABS: ORAL_TABLET | ORAL | 3 refills | Status: DC

## 2017-05-04 ENCOUNTER — Telehealth: Payer: Self-pay | Admitting: Hematology and Oncology

## 2017-05-04 ENCOUNTER — Inpatient Hospital Stay: Payer: 59 | Attending: Hematology and Oncology | Admitting: Hematology and Oncology

## 2017-05-04 VITALS — BP 157/90 | HR 93 | Temp 98.5°F | Resp 20 | Ht 67.0 in | Wt 200.6 lb

## 2017-05-04 DIAGNOSIS — Z170 Estrogen receptor positive status [ER+]: Secondary | ICD-10-CM | POA: Diagnosis not present

## 2017-05-04 DIAGNOSIS — Z780 Asymptomatic menopausal state: Secondary | ICD-10-CM

## 2017-05-04 DIAGNOSIS — Z880 Allergy status to penicillin: Secondary | ICD-10-CM | POA: Insufficient documentation

## 2017-05-04 DIAGNOSIS — C50412 Malignant neoplasm of upper-outer quadrant of left female breast: Secondary | ICD-10-CM

## 2017-05-04 DIAGNOSIS — Z79811 Long term (current) use of aromatase inhibitors: Secondary | ICD-10-CM

## 2017-05-04 DIAGNOSIS — Z923 Personal history of irradiation: Secondary | ICD-10-CM | POA: Diagnosis not present

## 2017-05-04 DIAGNOSIS — Z853 Personal history of malignant neoplasm of breast: Secondary | ICD-10-CM | POA: Diagnosis not present

## 2017-05-04 DIAGNOSIS — Z79899 Other long term (current) drug therapy: Secondary | ICD-10-CM | POA: Diagnosis not present

## 2017-05-04 DIAGNOSIS — Z08 Encounter for follow-up examination after completed treatment for malignant neoplasm: Secondary | ICD-10-CM | POA: Diagnosis present

## 2017-05-04 MED ORDER — ANASTROZOLE 1 MG PO TABS: ORAL_TABLET | ORAL | 3 refills | Status: DC

## 2017-05-04 NOTE — Progress Notes (Signed)
Patient Care Team: [name], MD as PCP - General (Endocrinology)  DIAGNOSIS:  Encounter Diagnosis  Name Primary?  . Primary cancer of upper outer quadrant of left female breast ([place])     SUMMARY OF ONCOLOGIC HISTORY:   Primary cancer of upper outer quadrant of left female breast ([place])   11/10/2011 Initial Diagnosis    Cancer of upper-outer quadrant of female breast: 1.8 cm left breast mass at 1:30 position IDC ER/PR positive HER-2 negative      11/25/2011 Surgery    Left breast lumpectomy 1.5 cm grade 2 IDC with DCIS ER/PR positive HER-2 negative Ki-67 of 62%, 1 SLN positive for micrometastatic Oncotype DX recurrence score 16 (ROR 10%)      02/14/2012 - 04/02/2012 Radiation Therapy    Radiation therapy to lumpectomy site      04/30/2012 -  Anti-estrogen oral therapy    Arimidex 1 mg daily 5 years is the plan       CHIEF COMPLIANT: Follow-up on anastrozole therapy  INTERVAL HISTORY: Katherine Coleman is a 62-year-old with above-mentioned history of left breast cancer treated with lumpectomy radiation is currently on anastrozole therapy.  She is tolerating anastrozole extremely well.  She does not have any hot flashes or night sweats or myalgias or arthralgias.  Previous bone densities were normal.  REVIEW OF SYSTEMS:   Constitutional: Denies fevers, chills or abnormal weight loss Eyes: Denies blurriness of vision Ears, nose, mouth, throat, and face: Denies mucositis or sore throat Respiratory: Denies cough, dyspnea or wheezes Cardiovascular: Denies palpitation, chest discomfort Gastrointestinal:  Denies nausea, heartburn or change in bowel habits Skin: Denies abnormal skin rashes Lymphatics: Denies new lymphadenopathy or easy bruising Neurological:Denies numbness, tingling or new weaknesses Behavioral/Psych: Mood is stable, no new changes  Extremities: No lower extremity edema Breast:  denies any pain or lumps or nodules in either breasts All other systems were  reviewed with the patient and are negative.  I have reviewed the past medical history, past surgical history, social history and family history with the patient and they are unchanged from previous note.  ALLERGIES:  is allergic to penicillins.  MEDICATIONS:  Current Outpatient Medications  Medication Sig Dispense Refill  . anastrozole (ARIMIDEX) 1 MG tablet TAKE 1 TABLET (1 MG TOTAL) BY MOUTH DAILY. 90 tablet 3  . Calcium-Vitamin D (CALTRATE 600 PLUS-VIT D PO) Take by mouth 2 (two) times daily.    . cholecalciferol (VITAMIN D) 1000 UNITS tablet Take 1,000 Units by mouth daily.    Marland Kitchen loratadine (CLARITIN) 10 MG tablet Take 10 mg by mouth daily.    . Multiple Vitamin (MULTIVITAMIN) tablet Take 1 tablet by mouth daily.       No current facility-administered medications for this visit.     PHYSICAL EXAMINATION: ECOG PERFORMANCE STATUS: 0 - Asymptomatic  Vitals:   05/04/17 1117  BP: (!) 157/90  Pulse: 93  Resp: 20  Temp: 98.5 F (36.9 C)  SpO2: 100%   Filed Weights   05/04/17 1117  Weight: 200 lb 9.6 oz (91 kg)    GENERAL:alert, no distress and comfortable SKIN: skin color, texture, turgor are normal, no rashes or significant lesions EYES: normal, Conjunctiva are pink and non-injected, sclera clear OROPHARYNX:no exudate, no erythema and lips, buccal mucosa, and tongue normal  NECK: supple, thyroid normal size, non-tender, without nodularity LYMPH:  no palpable lymphadenopathy in the cervical, axillary or inguinal LUNGS: clear to auscultation and percussion with normal breathing effort HEART: regular rate & rhythm and  no murmurs and no lower extremity edema ABDOMEN:abdomen soft, non-tender and normal bowel sounds MUSCULOSKELETAL:no cyanosis of digits and no clubbing  NEURO: alert & oriented x 3 with fluent speech, no focal motor/sensory deficits EXTREMITIES: No lower extremity edema BREAST: No palpable masses or nodules in either right or left breasts. No palpable axillary  supraclavicular or infraclavicular adenopathy no breast tenderness or nipple discharge. (exam performed in the presence of a chaperone)  LABORATORY DATA:  I have reviewed the data as listed CMP Latest Ref Rng & Units 07/27/2016 08/04/2015 07/21/2015  Glucose 65 - 99 mg/dL 90 97 150(H)  BUN 7 - 25 mg/dL _0 Creatinine 0.50 - 1.05 mg/dL 0.87 0.88 0.90  Sodium 135 - 146 mmol/L 138 140 139  Potassium 3.5 - 5.3 mmol/L 3.9 4.3 3.4(L)  Chloride 98 - 110 mmol/L 100 103 103  CO2 20 - 31 mmol/L _1 Calcium 8.6 - 10.4 mg/dL 9.5 9.2 9.3  Total Protein 6.1 - 8.1 g/dL 7.5 6.8 6.8  Total Bilirubin 0.2 - 1.2 mg/dL 0.7 0.6 0.6  Alkaline Phos 33 - 130 U/L 95 89 87  AST 10 - 35 U/L _2 ALT 6 - 29 U/L _3 Lab Results  Component Value Date   WBC 5.6 07/27/2016   HGB 13.8 07/27/2016   HCT 41.3 07/27/2016   MCV 82.4 07/27/2016   PLT 252 07/27/2016   NEUTROABS 2,408 07/27/2016    ASSESSMENT & PLAN:  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 She gets her annual GYN exams.  I recommended extending anastrozole therapy to 7 years.   Breast Cancer Surveillance: 1. Breast exam 05/04/2017: Normal 2. Mammogram  11/15/2016: Normal Density cat B 3. Bone density 06/03/14: T score 0.5 (Normal) with need a new bone density test   I will order a bone density test to be done this year.  Return to clinic in 1 year.    No orders of the defined types were placed in this encounter.  The patient has a good understanding of the overall plan. she agrees with it. she will call with any problems that may develop before the next visit here.    Harriette Ohara, MD 05/04/17

## 2017-05-04 NOTE — Telephone Encounter (Signed)
Gave patient AVs and calendar of upcoming April 2020 appointments.  °

## 2017-05-04 NOTE — Assessment & Plan Note (Signed)
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 She gets her annual GYN exams.  I recommended extending anastrozole therapy to 7 years.   Breast Cancer Surveillance: 1. Breast exam 05/04/2017: Normal 2. Mammogram  11/15/2016: Normal Density cat B 3. Bone density 06/03/14: T score 0.5 (Normal) with need a new bone density test   Return to clinic in 1 year.

## 2017-05-05 ENCOUNTER — Ambulatory Visit: Payer: 59 | Admitting: Hematology and Oncology

## 2017-05-30 ENCOUNTER — Encounter: Payer: Self-pay | Admitting: Gynecology

## 2017-05-30 ENCOUNTER — Ambulatory Visit
Admission: RE | Admit: 2017-05-30 | Discharge: 2017-05-30 | Disposition: A | Discharge status: Home / Self Care | Payer: 59 | Source: Ambulatory Visit | Attending: Hematology and Oncology | Admitting: Hematology and Oncology

## 2017-05-30 DIAGNOSIS — Z780 Asymptomatic menopausal state: Secondary | ICD-10-CM

## 2017-07-28 ENCOUNTER — Ambulatory Visit (INDEPENDENT_AMBULATORY_CARE_PROVIDER_SITE_OTHER): Payer: 59 | Admitting: Gynecology

## 2017-07-28 ENCOUNTER — Encounter: Payer: Self-pay | Admitting: Gynecology

## 2017-07-28 ENCOUNTER — Encounter: Payer: 59 | Admitting: Gynecology

## 2017-07-28 VITALS — BP 124/80 | Ht 67.0 in | Wt 199.0 lb

## 2017-07-28 DIAGNOSIS — Z01419 Encounter for gynecological examination (general) (routine) without abnormal findings: Secondary | ICD-10-CM | POA: Diagnosis not present

## 2017-07-28 DIAGNOSIS — Z13220 Encounter for screening for lipoid disorders: Secondary | ICD-10-CM

## 2017-07-28 DIAGNOSIS — Z1329 Encounter for screening for other suspected endocrine disorder: Secondary | ICD-10-CM

## 2017-07-28 DIAGNOSIS — Z1151 Encounter for screening for human papillomavirus (HPV): Secondary | ICD-10-CM

## 2017-07-28 DIAGNOSIS — Z853 Personal history of malignant neoplasm of breast: Secondary | ICD-10-CM

## 2017-07-28 DIAGNOSIS — N952 Postmenopausal atrophic vaginitis: Secondary | ICD-10-CM | POA: Diagnosis not present

## 2017-07-28 LAB — CBC WITH DIFFERENTIAL/PLATELET
BASOS ABS: 39 {cells}/uL (ref 0–200)
Basophils Relative: 0.7 %
EOS ABS: 110 {cells}/uL (ref 15–500)
EOS PCT: 2 %
HCT: 40.4 % (ref 35.0–45.0)
Hemoglobin: 13.7 g/dL (ref 11.7–15.5)
LYMPHS ABS: 2343 {cells}/uL (ref 850–3900)
MCH: 27.5 pg (ref 27.0–33.0)
MCHC: 33.9 g/dL (ref 32.0–36.0)
MCV: 81.1 fL (ref 80.0–100.0)
MPV: 10.3 fL (ref 7.5–12.5)
Monocytes Relative: 9 %
NEUTROS PCT: 45.7 %
Neutro Abs: 2514 cells/uL (ref 1500–7800)
Platelets: 251 10*3/uL (ref 140–400)
RBC: 4.98 10*6/uL (ref 3.80–5.10)
RDW: 14 % (ref 11.0–15.0)
Total Lymphocyte: 42.6 %
WBC mixed population: 495 cells/uL (ref 200–950)
WBC: 5.5 10*3/uL (ref 3.8–10.8)

## 2017-07-28 LAB — COMPREHENSIVE METABOLIC PANEL
AG Ratio: 1.8 (calc) (ref 1.0–2.5)
ALKALINE PHOSPHATASE (APISO): 104 U/L (ref 33–130)
ALT: 23 U/L (ref 6–29)
AST: 23 U/L (ref 10–35)
Albumin: 4.9 g/dL (ref 3.6–5.1)
BILIRUBIN TOTAL: 0.5 mg/dL (ref 0.2–1.2)
BUN: 13 mg/dL (ref 7–25)
CALCIUM: 9.9 mg/dL (ref 8.6–10.4)
CO2: 27 mmol/L (ref 20–32)
Chloride: 99 mmol/L (ref 98–110)
Creat: 0.83 mg/dL (ref 0.50–0.99)
Globulin: 2.7 g/dL (calc) (ref 1.9–3.7)
Glucose, Bld: 97 mg/dL (ref 65–99)
Potassium: 4.1 mmol/L (ref 3.5–5.3)
SODIUM: 139 mmol/L (ref 135–146)
TOTAL PROTEIN: 7.6 g/dL (ref 6.1–8.1)

## 2017-07-28 LAB — LIPID PANEL
CHOLESTEROL: 219 mg/dL — AB (ref <200)
HDL: 61 mg/dL (ref >50)
LDL Cholesterol (Calc): 138 mg/dL (calc) — ABNORMAL HIGH
Non-HDL Cholesterol (Calc): 158 mg/dL (calc) — ABNORMAL HIGH (ref <130)
TRIGLYCERIDES: 96 mg/dL (ref <150)
Total CHOL/HDL Ratio: 3.6 (calc) (ref <5.0)

## 2017-07-28 LAB — TSH: TSH: 2.7 m[IU]/L (ref 0.40–4.50)

## 2017-07-28 NOTE — Patient Instructions (Signed)
Follow-up in 1 year for annual exam, sooner as needed. 

## 2017-07-28 NOTE — Progress Notes (Signed)
    Katherine Coleman Feb 24, 1955 [id number]        62 y.o.  G1P1001 for annual gynecologic exam.  Doing well without gynecologic complaints  Past medical history,surgical history, problem list, medications, allergies, family history and social history were all reviewed and documented as reviewed in the EPIC chart.  ROS:  Performed with pertinent positives and negatives included in the history, assessment and plan.   Additional significant findings : None   Exam: Caryn Bee assistant Vitals:   07/28/17 1326  BP: 124/80  Weight: 199 lb (90.3 kg)  Height: 5\' 7"  (1.702 m)   Body mass index is 31.17 kg/m.  General appearance:  Normal affect, orientation and appearance. Skin: Grossly normal HEENT: Without gross lesions.  No cervical or supraclavicular adenopathy. Thyroid normal.  Lungs:  Clear without wheezing, rales or rhonchi Cardiac: RR, without RMG Abdominal:  Soft, nontender, without masses, guarding, rebound, organomegaly or hernia Breasts:  Examined lying and sitting without masses, retractions, discharge or axillary adenopathy.  Well-healed left lumpectomy scar Pelvic:  Ext, BUS, Vagina: With atrophic changes  Cervix: With atrophic changes.  Pap smear/HPV  Uterus: Anteverted, normal size, shape and contour, midline and mobile nontender   Adnexa: Without masses or tenderness    Anus and perineum: Normal   Rectovaginal: Normal sphincter tone without palpated masses or tenderness.    Assessment/Plan:  62 y.o. G35P1001 female for annual gynecologic exam.  1. Postmenopausal/atrophic genital changes.  No significant hot flushes, night sweats, vaginal dryness or any vaginal bleeding. 2. History of left breast cancer.  Exam NED.  Mammography 11/2016.  Continues on anastrozole.  Is actively followed by oncology. 3. DEXA 2019 normal.  Repeat at oncology's recommendation. 4. Pap smear/HPV 2015.  Pap smear/HPV done today.  No history of abnormal Pap smears  previously. 5. Colonoscopy due this year and patient is in the process of arranging. 6. Health maintenance.  Patient requests baseline labs.  CBC, CMP, lipid profile and TSH ordered.  Follow-up 1 year, sooner as needed.   Anastasio Auerbach MD, 1:46 PM 07/28/2017

## 2017-07-28 NOTE — Addendum Note (Signed)
Addended by: Nelva Nay on: 07/28/2017 02:06 PM   Modules accepted: Orders

## 2017-07-31 ENCOUNTER — Encounter: Payer: Self-pay | Admitting: Gynecology

## 2017-07-31 ENCOUNTER — Other Ambulatory Visit: Payer: Self-pay | Admitting: Gynecology

## 2017-07-31 DIAGNOSIS — E7800 Pure hypercholesterolemia, unspecified: Secondary | ICD-10-CM

## 2017-07-31 LAB — PAP IG AND HPV HIGH-RISK: HPV DNA HIGH RISK: NOT DETECTED

## 2017-10-19 ENCOUNTER — Other Ambulatory Visit: Payer: Self-pay | Admitting: Hematology and Oncology

## 2017-10-19 DIAGNOSIS — Z1231 Encounter for screening mammogram for malignant neoplasm of breast: Secondary | ICD-10-CM

## 2017-10-19 DIAGNOSIS — Z853 Personal history of malignant neoplasm of breast: Secondary | ICD-10-CM

## 2017-11-17 ENCOUNTER — Ambulatory Visit
Admission: RE | Admit: 2017-11-17 | Discharge: 2017-11-17 | Disposition: A | Discharge status: Home / Self Care | Payer: 59 | Source: Ambulatory Visit | Attending: Hematology and Oncology | Admitting: Hematology and Oncology

## 2017-11-17 DIAGNOSIS — Z853 Personal history of malignant neoplasm of breast: Secondary | ICD-10-CM

## 2017-11-17 DIAGNOSIS — Z1231 Encounter for screening mammogram for malignant neoplasm of breast: Secondary | ICD-10-CM

## 2018-05-05 ENCOUNTER — Encounter: Payer: Self-pay | Admitting: Hematology and Oncology

## 2018-05-08 NOTE — Progress Notes (Signed)
HEMATOLOGY-ONCOLOGY TELEPHONE VISIT PROGRESS NOTE ° °I connected with Katherine Coleman on 05/10/2018 at 11:30 AM EDT by telephone and verified that I am speaking with the correct person using two identifiers.  °I discussed the limitations, risks, security and privacy concerns of performing an evaluation and management service by telephone and the availability of in person appointments.  °I also discussed with the patient that there may be a patient responsible charge related to this service. The patient expressed understanding and agreed to proceed.  ° °History of Present Illness: Katherine Coleman is a 62 y.o. female with above-mentioned history of left breast cancer treated with lumpectomy and radiation who is currently on anastrozole therapy. I last saw her a year ago. Her most recent mammogram on 11/17/17 showed no evidence of malignancy.  ° °  °Primary cancer of upper outer quadrant of left female breast (HCC)  ° 11/10/2011 Initial Diagnosis  °  Cancer of upper-outer quadrant of female breast: 1.8 cm left breast mass at 1:30 position IDC ER/PR positive HER-2 negative °  ° 11/25/2011 Surgery  °  Left breast lumpectomy 1.5 cm grade 2 IDC with DCIS ER/PR positive HER-2 negative Ki-67 of 62%, 1 SLN positive for micrometastatic Oncotype DX recurrence score 16 (ROR 10%) °  ° 02/14/2012 - 04/02/2012 Radiation Therapy  °  Radiation therapy to lumpectomy site °  ° 04/30/2012 -  Anti-estrogen oral therapy  °  Arimidex 1 mg daily  °  ° ° °Observations/Objective:  °Denies any pain or discomfort in the breast. °Tolerating anastrozole extremely well. °She is taking care of her elderly parents. 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 She gets her annual GYN exams.   °I recommended extending anastrozole therapy to 7 years which will be completed March 2021.  °  °Breast Cancer Surveillance: °1. Breast exam 05/04/2017: Normal °2. Mammogram   11/17/2017: Normal Density cat B °3. Bone density 05/30/2017: T score 0.6 (Normal): I counseled her about the bone density results which are fantastic. °  °Return to clinic in 1 year. ° °I discussed the assessment and treatment plan with the patient. The patient was provided an opportunity to ask questions and all were answered. The patient agreed with the plan and demonstrated an understanding of the instructions. The patient was advised to call back or seek an in-person evaluation if the symptoms worsen or if the condition fails to improve as anticipated.  ° °I provided 15 minutes of non-face-to-face time during this encounter.  ° °Vinay K Gudena, MD °05/10/2018   ° ° I, Molly Dorshimer, am acting as scribe for Vinay Gudena, MD. ° °I have reviewed the above documentation for accuracy and completeness, and I agree with the above. °  °

## 2018-05-10 ENCOUNTER — Inpatient Hospital Stay: Payer: 59 | Attending: Hematology and Oncology | Admitting: Hematology and Oncology

## 2018-05-10 DIAGNOSIS — Z853 Personal history of malignant neoplasm of breast: Secondary | ICD-10-CM | POA: Diagnosis not present

## 2018-05-10 DIAGNOSIS — Z79818 Long term (current) use of other agents affecting estrogen receptors and estrogen levels: Secondary | ICD-10-CM | POA: Diagnosis not present

## 2018-05-10 DIAGNOSIS — C50412 Malignant neoplasm of upper-outer quadrant of left female breast: Secondary | ICD-10-CM

## 2018-05-10 MED ORDER — ANASTROZOLE 1 MG PO TABS: ORAL_TABLET | ORAL | 3 refills | Status: DC

## 2018-05-10 NOTE — Assessment & Plan Note (Addendum)
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 She gets her annual GYN exams.  I recommended extending anastrozole therapy to 7 years which will be completed March 2021.   Breast Cancer Surveillance: 1. Breast exam 05/04/2017: Normal 2. Mammogram  11/17/2017: Normal Density cat B 3. Bone density 05/30/2017: T score 0.6 (Normal): I counseled her about the bone density results which are fantastic.  Return to clinic in 1 year.

## 2018-05-15 ENCOUNTER — Telehealth: Payer: Self-pay | Admitting: Hematology and Oncology

## 2018-05-15 NOTE — Telephone Encounter (Signed)
Scheduled per 4/2 sch msg. Mailed printout.

## 2018-07-30 ENCOUNTER — Encounter: Payer: 59 | Admitting: Gynecology

## 2018-08-01 ENCOUNTER — Other Ambulatory Visit: Payer: Self-pay

## 2018-08-02 ENCOUNTER — Encounter: Payer: Self-pay | Admitting: Gynecology

## 2018-08-02 ENCOUNTER — Ambulatory Visit (INDEPENDENT_AMBULATORY_CARE_PROVIDER_SITE_OTHER): Payer: 59 | Admitting: Gynecology

## 2018-08-02 VITALS — BP 136/84 | Ht 67.0 in | Wt 196.0 lb

## 2018-08-02 DIAGNOSIS — Z13220 Encounter for screening for lipoid disorders: Secondary | ICD-10-CM

## 2018-08-02 DIAGNOSIS — Z1329 Encounter for screening for other suspected endocrine disorder: Secondary | ICD-10-CM | POA: Diagnosis not present

## 2018-08-02 DIAGNOSIS — Z853 Personal history of malignant neoplasm of breast: Secondary | ICD-10-CM

## 2018-08-02 DIAGNOSIS — N952 Postmenopausal atrophic vaginitis: Secondary | ICD-10-CM

## 2018-08-02 DIAGNOSIS — Z01419 Encounter for gynecological examination (general) (routine) without abnormal findings: Secondary | ICD-10-CM

## 2018-08-02 NOTE — Patient Instructions (Signed)
Follow-up in 1 year, sooner as needed. 

## 2018-08-02 NOTE — Progress Notes (Signed)
    Katherine Coleman 10-03-1955 [id number]        62 y.o.  G1P1001 for annual gynecologic exam.  Without gynecologic complaints  Past medical history,surgical history, problem list, medications, allergies, family history and social history were all reviewed and documented as reviewed in the EPIC chart.  ROS:  Performed with pertinent positives and negatives included in the history, assessment and plan.   Additional significant findings : None   Exam: Caryn Bee assistant Vitals:   08/02/18 1600  BP: 136/84  Weight: 196 lb (88.9 kg)  Height: 5\' 7"  (1.702 m)   Body mass index is 30.7 kg/m.  General appearance:  Normal affect, orientation and appearance. Skin: Grossly normal HEENT: Without gross lesions.  No cervical or supraclavicular adenopathy. Thyroid normal.  Lungs:  Clear without wheezing, rales or rhonchi Cardiac: RR, without RMG Abdominal:  Soft, nontender, without masses, guarding, rebound, organomegaly or hernia Breasts:  Examined lying and sitting without masses, retractions, discharge or axillary adenopathy.  Well-healed left lumpectomy scar Pelvic:  Ext, BUS, Vagina: Normal with atrophic changes  Cervix: Normal with atrophic changes  Uterus: Anteverted, normal size, shape and contour, midline and mobile nontender   Adnexa: Without masses or tenderness    Anus and perineum: Normal   Rectovaginal: Normal sphincter tone without palpated masses or tenderness.    Assessment/Plan:  63 y.o. G27P1001 female for annual gynecologic exam.   1. Postmenopausal.  No significant menopausal symptoms or any vaginal bleeding. 2. History of left breast cancer.  Mammography 11/2017.  Continue with annual mammography when due.  Exam NED.  Actively followed by oncology on anastrozole 3. DEXA 2019 normal.  Repeat DEXA at oncology recommendations. 4. Pap smear/HPV 07/2017.  No Pap smear done today.  No history of significant abnormal Pap smears.  Plan repeat Pap smear/HPV at 5-year  interval per current screening guidelines. 5. Colonoscopy 2019.  Having some McBurney's point aching that occurs every 2 weeks or so and lasts for minutes.  Seems associated with certain foods.  Started after her colonoscopy.  Reports biopsies in this area.  Recommend patient monitor for now and follow-up with gastroenterology if continues. 6. Health maintenance.  Requests routine lab work.  CBC, CMP, lipid profile and TSH ordered.  Follow-up 1 year, sooner as needed.   Anastasio Auerbach MD, 4:26 PM 08/02/2018

## 2018-08-03 ENCOUNTER — Encounter: Payer: Self-pay | Admitting: Gynecology

## 2018-08-03 ENCOUNTER — Other Ambulatory Visit: Payer: Self-pay | Admitting: Gynecology

## 2018-08-03 DIAGNOSIS — E789 Disorder of lipoprotein metabolism, unspecified: Secondary | ICD-10-CM

## 2018-08-03 LAB — CBC WITH DIFFERENTIAL/PLATELET
Absolute Monocytes: 587 cells/uL (ref 200–950)
Basophils Absolute: 40 cells/uL (ref 0–200)
Basophils Relative: 0.7 %
Eosinophils Absolute: 103 cells/uL (ref 15–500)
Eosinophils Relative: 1.8 %
HCT: 43.5 % (ref 35.0–45.0)
Hemoglobin: 14.1 g/dL (ref 11.7–15.5)
Lymphs Abs: 2514 cells/uL (ref 850–3900)
MCH: 27 pg (ref 27.0–33.0)
MCHC: 32.4 g/dL (ref 32.0–36.0)
MCV: 83.3 fL (ref 80.0–100.0)
MPV: 10.6 fL (ref 7.5–12.5)
Monocytes Relative: 10.3 %
Neutro Abs: 2457 cells/uL (ref 1500–7800)
Neutrophils Relative %: 43.1 %
Platelets: 247 10*3/uL (ref 140–400)
RBC: 5.22 10*6/uL — ABNORMAL HIGH (ref 3.80–5.10)
RDW: 14.4 % (ref 11.0–15.0)
Total Lymphocyte: 44.1 %
WBC: 5.7 10*3/uL (ref 3.8–10.8)

## 2018-08-03 LAB — LIPID PANEL
Cholesterol: 222 mg/dL — ABNORMAL HIGH (ref <200)
HDL: 68 mg/dL (ref >=50)
LDL Cholesterol (Calc): 132 mg/dL (calc) — ABNORMAL HIGH
Non-HDL Cholesterol (Calc): 154 mg/dL (calc) — ABNORMAL HIGH (ref <130)
Total CHOL/HDL Ratio: 3.3 (calc) (ref <5.0)
Triglycerides: 108 mg/dL (ref <150)

## 2018-08-03 LAB — COMPREHENSIVE METABOLIC PANEL
AG Ratio: 1.9 (calc) (ref 1.0–2.5)
ALT: 23 U/L (ref 6–29)
AST: 26 U/L (ref 10–35)
Albumin: 5 g/dL (ref 3.6–5.1)
Alkaline phosphatase (APISO): 103 U/L (ref 37–153)
BUN: 11 mg/dL (ref 7–25)
CO2: 28 mmol/L (ref 20–32)
Calcium: 9.9 mg/dL (ref 8.6–10.4)
Chloride: 100 mmol/L (ref 98–110)
Creat: 0.76 mg/dL (ref 0.50–0.99)
Globulin: 2.7 g/dL (calc) (ref 1.9–3.7)
Glucose, Bld: 97 mg/dL (ref 65–99)
Potassium: 4 mmol/L (ref 3.5–5.3)
Sodium: 138 mmol/L (ref 135–146)
Total Bilirubin: 0.6 mg/dL (ref 0.2–1.2)
Total Protein: 7.7 g/dL (ref 6.1–8.1)

## 2018-08-03 LAB — TSH: TSH: 2.27 mIU/L (ref 0.40–4.50)

## 2018-10-30 ENCOUNTER — Other Ambulatory Visit: Payer: Self-pay | Admitting: Hematology and Oncology

## 2018-10-30 DIAGNOSIS — Z1231 Encounter for screening mammogram for malignant neoplasm of breast: Secondary | ICD-10-CM

## 2018-11-06 ENCOUNTER — Encounter: Payer: Self-pay | Admitting: Gynecology

## 2018-12-14 ENCOUNTER — Other Ambulatory Visit: Payer: Self-pay

## 2018-12-14 ENCOUNTER — Ambulatory Visit
Admission: RE | Admit: 2018-12-14 | Discharge: 2018-12-14 | Disposition: A | Discharge status: Home / Self Care | Payer: 59 | Source: Ambulatory Visit | Attending: Hematology and Oncology | Admitting: Hematology and Oncology

## 2018-12-14 DIAGNOSIS — Z1231 Encounter for screening mammogram for malignant neoplasm of breast: Secondary | ICD-10-CM

## 2019-05-12 NOTE — Progress Notes (Signed)
Patient Care Team: Patient, No Pcp Per as PCP - General (General Practice)  DIAGNOSIS:    ICD-10-CM   1. Primary cancer of upper outer quadrant of left female breast (Katherine Coleman)  C50.412     SUMMARY OF ONCOLOGIC HISTORY: Oncology History  Primary cancer of upper outer quadrant of left female breast (Katherine Coleman)  11/10/2011 Initial Diagnosis   Cancer of upper-outer quadrant of female breast: 1.8 cm left breast mass at 1:30 position IDC ER/PR positive HER-2 negative   11/25/2011 Surgery   Left breast lumpectomy 1.5 cm grade 2 IDC with DCIS ER/PR positive HER-2 negative Ki-67 of 62%, 1 SLN positive for micrometastatic Oncotype DX recurrence score 16 (ROR 10%)   02/14/2012 - 04/02/2012 Radiation Therapy   Radiation therapy to lumpectomy site   04/30/2012 -  Anti-estrogen oral therapy   Arimidex 1 mg daily      CHIEF COMPLIANT: Follow-up of left breast cancer on anastrozole  INTERVAL HISTORY: Katherine Coleman is a 64 y.o. with above-mentioned history of left breast cancer treated with lumpectomy, radiation, and who is currently on anastrozole therapy. Mammogram on 12/14/18 showed no evidence of malignancy. She presents to the clinic today for follow-up.   ALLERGIES:  is allergic to penicillins.  MEDICATIONS:  Current Outpatient Medications  Medication Sig Dispense Refill  . anastrozole (ARIMIDEX) 1 MG tablet TAKE 1 TABLET (1 MG TOTAL) BY MOUTH DAILY. 90 tablet 3  . Calcium-Vitamin D (CALTRATE 600 PLUS-VIT D PO) Take by mouth 2 (two) times daily.    . cholecalciferol (VITAMIN D) 1000 UNITS tablet Take 1,000 Units by mouth daily.    Marland Kitchen loratadine (CLARITIN) 10 MG tablet Take 10 mg by mouth daily.    . Multiple Vitamin (MULTIVITAMIN) tablet Take 1 tablet by mouth daily.       No current facility-administered medications for this visit.    PHYSICAL EXAMINATION: ECOG PERFORMANCE STATUS: 1 - Symptomatic but completely ambulatory  Vitals:   05/13/19 1026  BP: (!) 156/91  Pulse: 67  Resp:  17  Temp: 97.8 F (36.6 C)  SpO2: 99%   Filed Weights   05/13/19 1026  Weight: 184 lb 6.4 oz (83.6 kg)    BREAST: No palpable masses or nodules in either right or left breasts. No palpable axillary supraclavicular or infraclavicular adenopathy no breast tenderness or nipple discharge. (exam performed in the presence of a chaperone)  LABORATORY DATA:  I have reviewed the data as listed CMP Latest Ref Rng & Units 08/02/2018 07/28/2017 07/27/2016  Glucose 65 - 99 mg/dL 97 97 90  BUN 7 - 25 mg/dL '11 13 10  '$ Creatinine 0.50 - 0.99 mg/dL 0.76 0.83 0.87  Sodium 135 - 146 mmol/L 138 139 138  Potassium 3.5 - 5.3 mmol/L 4.0 4.1 3.9  Chloride 98 - 110 mmol/L 100 99 100  CO2 20 - 32 mmol/L '28 27 29  '$ Calcium 8.6 - 10.4 mg/dL 9.9 9.9 9.5  Total Protein 6.1 - 8.1 g/dL 7.7 7.6 7.5  Total Bilirubin 0.2 - 1.2 mg/dL 0.6 0.5 0.7  Alkaline Phos 33 - 130 U/L - - 95  AST 10 - 35 U/L '26 23 20  '$ ALT 6 - 29 U/L '23 23 22    '$ Lab Results  Component Value Date   WBC 5.7 08/02/2018   HGB 14.1 08/02/2018   HCT 43.5 08/02/2018   MCV 83.3 08/02/2018   PLT 247 08/02/2018   NEUTROABS 2,457 08/02/2018    ASSESSMENT & PLAN:  Primary cancer of upper  outer quadrant of left female breast 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 She gets her annual GYN exams.  I congratulated her on completing antiestrogen therapy.  Breast Cancer Surveillance: 1. Breast exam 05/13/2019: Normal 2. Mammogram11/10/2018: Normal Density cat B 3. Bone density 05/30/2017: T score 0.6 (Normal): I counseled her about the bone density results which are fantastic.  Return to clinic on an as-needed basis.    No orders of the defined types were placed in this  encounter.  The patient has a good understanding of the overall plan. she agrees with it. she will call with any problems that may develop before the next visit here.  Total time spent: 20 mins including face to face time and time spent for planning, charting and coordination of care  Nicholas Lose, MD 05/13/2019  I, Cloyde Reams Dorshimer, am acting as scribe for Dr. Nicholas Lose.  I have reviewed the above documentation for accuracy and completeness, and I agree with the above.

## 2019-05-13 ENCOUNTER — Other Ambulatory Visit: Payer: Self-pay

## 2019-05-13 ENCOUNTER — Inpatient Hospital Stay: Payer: 59 | Attending: Hematology and Oncology | Admitting: Hematology and Oncology

## 2019-05-13 DIAGNOSIS — C50412 Malignant neoplasm of upper-outer quadrant of left female breast: Secondary | ICD-10-CM | POA: Diagnosis not present

## 2019-05-13 DIAGNOSIS — Z79811 Long term (current) use of aromatase inhibitors: Secondary | ICD-10-CM | POA: Insufficient documentation

## 2019-05-13 DIAGNOSIS — Z170 Estrogen receptor positive status [ER+]: Secondary | ICD-10-CM | POA: Insufficient documentation

## 2019-05-13 NOTE — Assessment & Plan Note (Signed)
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 She gets her annual GYN exams.  I recommended extending anastrozole therapy to7 years which has been completed March 2021.  I congratulated her on completing antiestrogen therapy.  Breast Cancer Surveillance: 1. Breast exam 05/13/2019: Normal 2. Mammogram11/10/2018: Normal Density cat B 3. Bone density 05/30/2017: T score 0.6 (Normal): I counseled her about the bone density results which are fantastic.  Return to clinic in 1 year for follow-up with long-term survivorship clinic.

## 2019-06-10 ENCOUNTER — Other Ambulatory Visit: Payer: Self-pay | Admitting: Hematology and Oncology

## 2019-08-05 ENCOUNTER — Other Ambulatory Visit: Payer: Self-pay

## 2019-08-06 ENCOUNTER — Ambulatory Visit (INDEPENDENT_AMBULATORY_CARE_PROVIDER_SITE_OTHER): Payer: 59 | Admitting: Obstetrics and Gynecology

## 2019-08-06 ENCOUNTER — Encounter: Payer: Self-pay | Admitting: Obstetrics and Gynecology

## 2019-08-06 VITALS — BP 122/78 | Ht 67.0 in | Wt 183.0 lb

## 2019-08-06 DIAGNOSIS — Z01419 Encounter for gynecological examination (general) (routine) without abnormal findings: Secondary | ICD-10-CM | POA: Diagnosis not present

## 2019-08-06 DIAGNOSIS — Z1329 Encounter for screening for other suspected endocrine disorder: Secondary | ICD-10-CM

## 2019-08-06 DIAGNOSIS — Z13220 Encounter for screening for lipoid disorders: Secondary | ICD-10-CM | POA: Diagnosis not present

## 2019-08-06 DIAGNOSIS — E789 Disorder of lipoprotein metabolism, unspecified: Secondary | ICD-10-CM

## 2019-08-06 NOTE — Progress Notes (Signed)
   Katherine Coleman 1955/09/04 [id number]  SUBJECTIVE:  65 y.o. G1P1001 female for annual routine gynecologic exam. She has no gynecologic concerns.  Current Outpatient Medications  Medication Sig Dispense Refill  . Calcium-Vitamin D (CALTRATE 600 PLUS-VIT D PO) Take by mouth 2 (two) times daily.    . cholecalciferol (VITAMIN D) 1000 UNITS tablet Take 1,000 Units by mouth daily.    Marland Kitchen loratadine (CLARITIN) 10 MG tablet Take 10 mg by mouth daily.    . Multiple Vitamin (MULTIVITAMIN) tablet Take 1 tablet by mouth daily.       No current facility-administered medications for this visit.   Allergies: Penicillins  No LMP recorded. Patient is postmenopausal.  Past medical history,surgical history, problem list, medications, allergies, family history and social history were all reviewed and documented as reviewed in the EPIC chart.  ROS:  Feeling well. No dyspnea or chest pain on exertion.  No abdominal pain, change in bowel habits, black or bloody stools.  No urinary tract symptoms. GYN ROS: no abnormal bleeding, pelvic pain or discharge, no breast pain or new or enlarging lumps on self exam. No neurological complaints.    OBJECTIVE:  BP 122/78   Ht 5\' 7"  (1.702 m)   Wt 183 lb (83 kg)   BMI 28.66 kg/m  The patient appears well, alert, oriented x 3, in no distress. ENT normal.  Neck supple. No cervical or supraclavicular adenopathy or thyromegaly.  Lungs are clear, good air entry, no wheezes, rhonchi or rales. S1 and S2 normal, no murmurs, regular rate and rhythm.  Abdomen soft without tenderness, guarding, mass or organomegaly.  Neurological is normal, no focal findings.  BREAST EXAM: breasts appear normal, no suspicious masses, no skin or nipple changes or axillary nodes, left lumpectomy scar well healed  PELVIC EXAM: VULVA: normal appearing vulva with no masses, tenderness or lesions, VAGINA: normal appearing vagina with normal color and discharge, no lesions, CERVIX: normal  appearing cervix without discharge or lesions, UTERUS: uterus is normal size, shape, consistency and nontender, ADNEXA: normal adnexa in size, nontender and no masses  Chaperone: Caryn Bee present during the examination  ASSESSMENT:  64 y.o. G1P1001 here for annual gynecologic exam  PLAN:   1. Postmenopausal.  No vaginal bleeding.  No significant menopausal symptoms. 2. Pap smear/HPV 07/2017.  No signficant history of abnormal Pap smears.  Next Pap smear due 2024 following the current guidelines recommending the 5 year interval. 3. History left breast cancer.  Mammogram 12/2018.  Normal breast exam today/NED.  Actively follows with oncology, just saw Dr. Lindi Adie in April. Completed Arimidex therapy. 4. Colonoscopy 2019.  Recommended that she follow up at the recommended interval.  Still is having some right mid abdomen tenderness/aching that occurs every few weeks.  She says this started after her colonoscopy but has not gotten any worse.  She says she had some biopsies in the area and has been following a diet as recommended per GI without much change.  Not overly bothersome to her but just wants to make mention of the symptoms.  She will continue to monitor and if they do get any worse than I would recommend following up with GI. 5. DEXA 2019 was normal.   Next DEXA recommended 2024 at 5 year interval.  6. Health maintenance.  She will proceed to lab today for routine screening blood work (lipids, CBC, CMP TSH, vitamin D).  Return annually or sooner, prn.  Joseph Pierini MD 08/06/19

## 2019-08-07 LAB — COMPREHENSIVE METABOLIC PANEL
AG Ratio: 1.8 (calc) (ref 1.0–2.5)
ALT: 16 U/L (ref 6–29)
AST: 20 U/L (ref 10–35)
Albumin: 4.6 g/dL (ref 3.6–5.1)
Alkaline phosphatase (APISO): 100 U/L (ref 37–153)
BUN: 15 mg/dL (ref 7–25)
CO2: 30 mmol/L (ref 20–32)
Calcium: 9.7 mg/dL (ref 8.6–10.4)
Chloride: 102 mmol/L (ref 98–110)
Creat: 0.8 mg/dL (ref 0.50–0.99)
Globulin: 2.5 g/dL (calc) (ref 1.9–3.7)
Glucose, Bld: 96 mg/dL (ref 65–99)
Potassium: 3.8 mmol/L (ref 3.5–5.3)
Sodium: 139 mmol/L (ref 135–146)
Total Bilirubin: 0.5 mg/dL (ref 0.2–1.2)
Total Protein: 7.1 g/dL (ref 6.1–8.1)

## 2019-08-07 LAB — CBC
HCT: 41.5 % (ref 35.0–45.0)
Hemoglobin: 13.6 g/dL (ref 11.7–15.5)
MCH: 27.7 pg (ref 27.0–33.0)
MCHC: 32.8 g/dL (ref 32.0–36.0)
MCV: 84.5 fL (ref 80.0–100.0)
MPV: 10.9 fL (ref 7.5–12.5)
Platelets: 235 10*3/uL (ref 140–400)
RBC: 4.91 10*6/uL (ref 3.80–5.10)
RDW: 14.3 % (ref 11.0–15.0)
WBC: 5.9 10*3/uL (ref 3.8–10.8)

## 2019-08-07 LAB — LIPID PANEL
Cholesterol: 187 mg/dL (ref <200)
HDL: 57 mg/dL (ref >=50)
LDL Cholesterol (Calc): 104 mg/dL (calc) — ABNORMAL HIGH
Non-HDL Cholesterol (Calc): 130 mg/dL (calc) — ABNORMAL HIGH (ref <130)
Total CHOL/HDL Ratio: 3.3 (calc) (ref <5.0)
Triglycerides: 144 mg/dL (ref <150)

## 2019-08-07 LAB — TSH: TSH: 1.72 mIU/L (ref 0.40–4.50)

## 2019-08-07 LAB — VITAMIN D 25 HYDROXY (VIT D DEFICIENCY, FRACTURES): Vit D, 25-Hydroxy: 39 ng/mL (ref 30–100)

## 2019-11-12 ENCOUNTER — Other Ambulatory Visit: Payer: Self-pay | Admitting: Hematology and Oncology

## 2019-11-12 DIAGNOSIS — Z1231 Encounter for screening mammogram for malignant neoplasm of breast: Secondary | ICD-10-CM

## 2019-12-16 ENCOUNTER — Ambulatory Visit
Admission: RE | Admit: 2019-12-16 | Discharge: 2019-12-16 | Disposition: A | Discharge status: Home / Self Care | Payer: 59 | Source: Ambulatory Visit | Attending: Hematology and Oncology | Admitting: Hematology and Oncology

## 2019-12-16 ENCOUNTER — Other Ambulatory Visit: Payer: Self-pay

## 2019-12-16 DIAGNOSIS — Z1231 Encounter for screening mammogram for malignant neoplasm of breast: Secondary | ICD-10-CM

## 2020-02-11 IMAGING — MG DIGITAL SCREENING BILAT W/ TOMO W/ CAD
8 series · 8 of 24 positions shown · non-contrast
Comparison: Previous exam(s).

CLINICAL DATA: Screening.

EXAM:
DIGITAL SCREENING BILATERAL MAMMOGRAM WITH TOMO AND CAD

[R CC synth-2D]
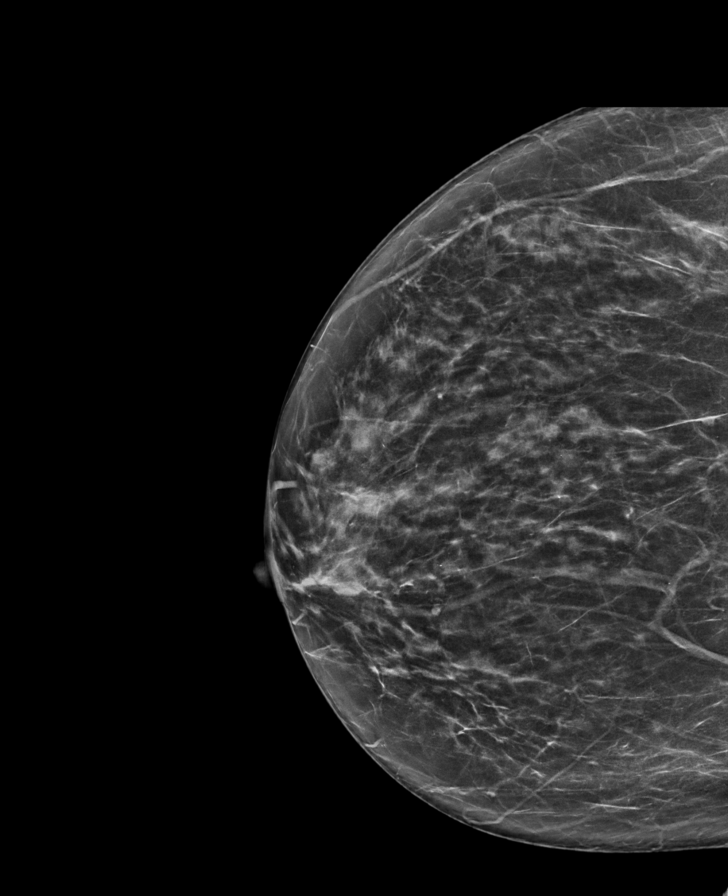

[L CC synth-2D]
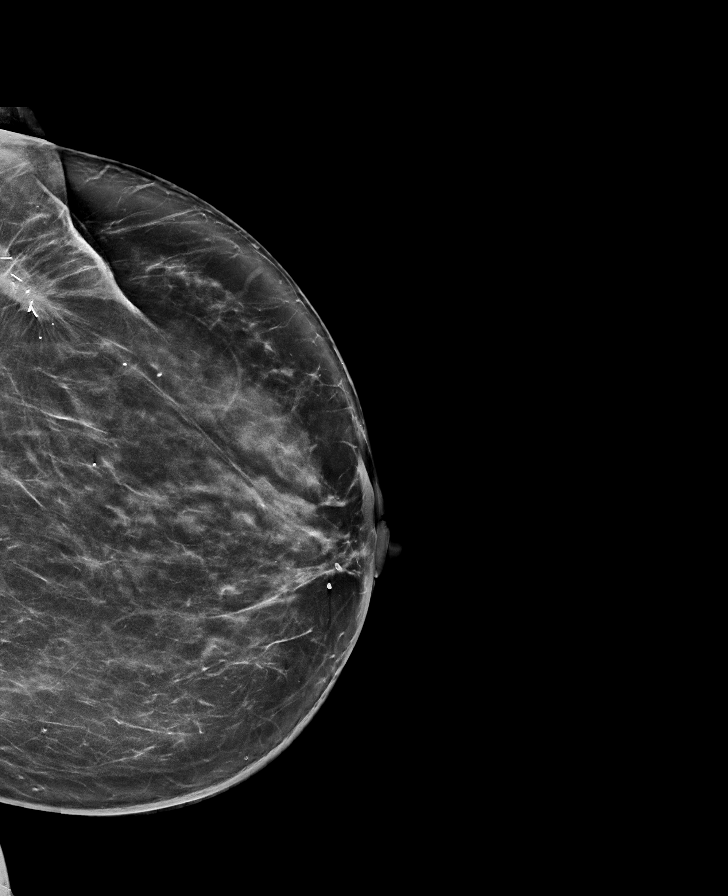

[R MLO synth-2D]
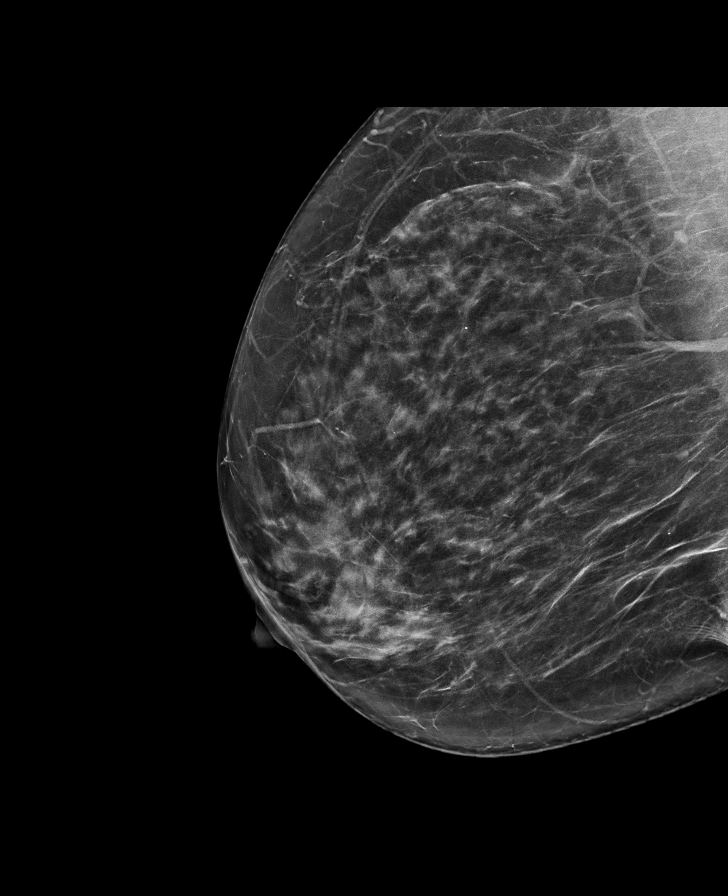

[L MLO synth-2D]
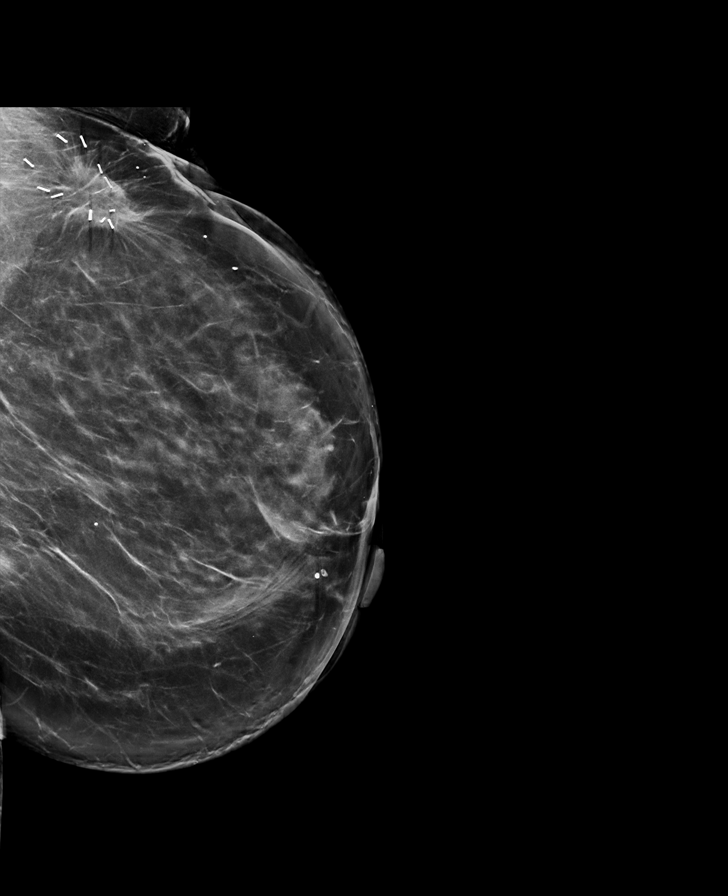

[R MLO tomo · tomo slice 40/79.0]
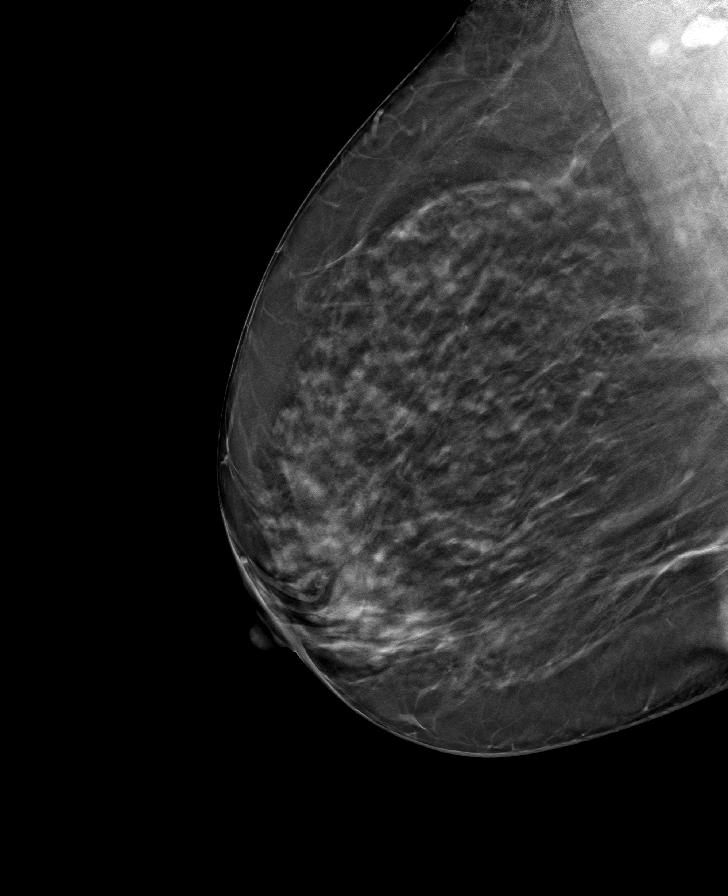

[R CC tomo · tomo slice 37/72.0]
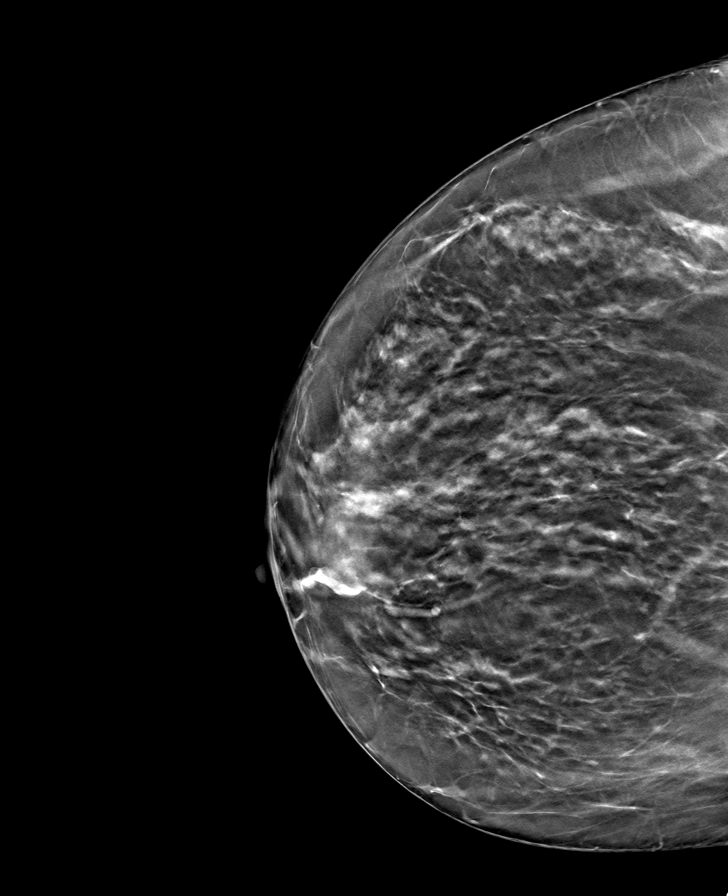

[L CC tomo · tomo slice 43/86.0]
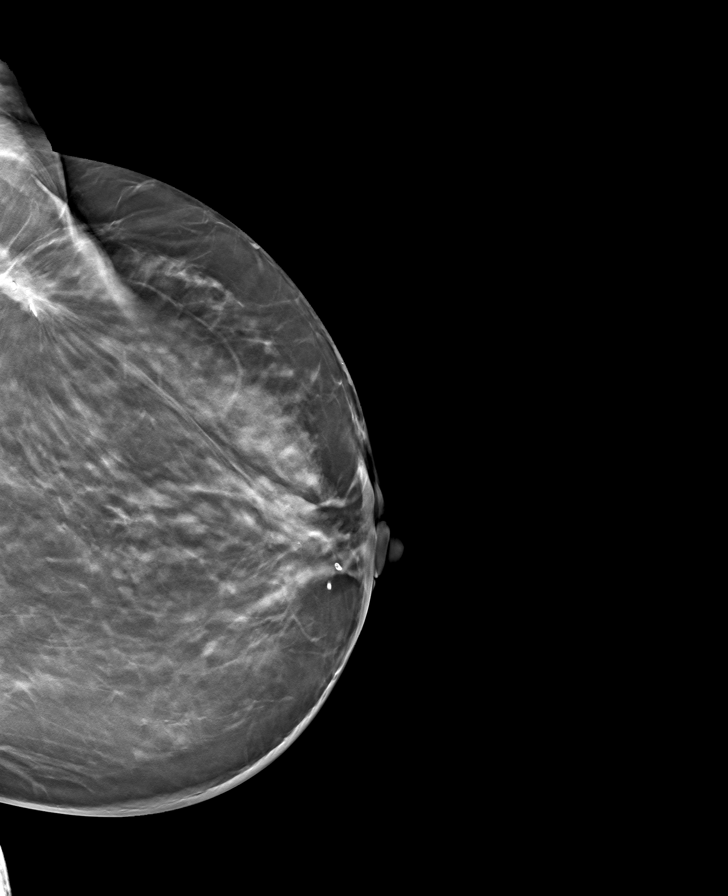

[L MLO tomo · tomo slice 49/96.0]
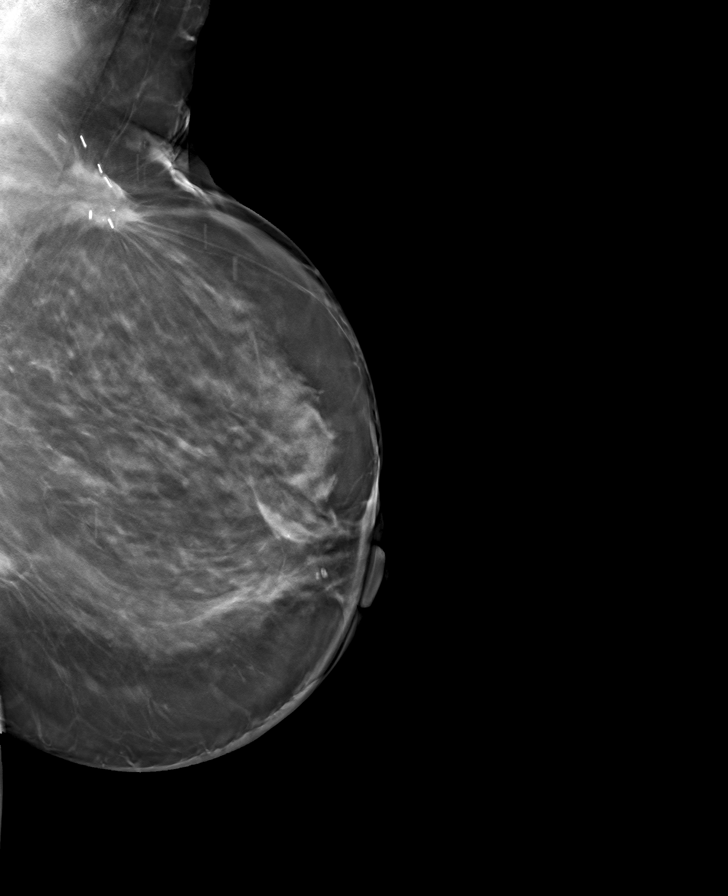

[8 of 24 positions shown; findings below may reference images not displayed]

ACR Breast Density Category c: The breast tissue is heterogeneously
dense, which may obscure small masses.
FINDINGS: There are no findings suspicious for malignancy. Images were
processed with CAD.
IMPRESSION: No mammographic evidence of malignancy. A result letter of this
screening mammogram will be mailed directly to the patient.

RECOMMENDATION:
Screening mammogram in one year. (Code:[id number])

BI-RADS CATEGORY  1: Negative.

## 2020-07-31 NOTE — Progress Notes (Addendum)
65 y.o. G74P1001 Married [place] American female here for annual exam.    No vaginal bleeding or spotting.   Works in [employer], technical sales.   PCP:   None.   No LMP recorded. Patient is postmenopausal.           Sexually active: No.  The current method of family planning is none.    Exercising: Yes.     Smoker:  no  Health Maintenance: Pap:  07-28-17 normal neg HPV History of abnormal Pap:  no MMG:  12-16-19 normal Colonoscopy:  2019 small polyps BMD:   05-30-17  Result  normal TDaP:  unsure Gardasil:   no HIV:neg Hep C:neg Screening Labs:  Hb today: yes, Urine today: no   reports that she has never smoked. She has never used smokeless tobacco. She reports current alcohol use. She reports that she does not use drugs.  Past Medical History:  Diagnosis Date   Breast cancer ([place]) 11/07/11   left breast 1 o'clock bx=invasive ductal ca,lymphovascular invasion  ER/PR=positive,her 2 neu=neg   Fibroid    Heart murmur    "nothing to worry about", childhood   History of breast lump/mass excision 01/12/12   re-excision left breast   Personal history of radiation therapy    Radiation 02/13/12-04/02/12   Left breast 60.4 gray   Seasonal allergies     Past Surgical History:  Procedure Laterality Date   BREAST LUMPECTOMY Left 11/25/2011   BREAST SURGERY  11/25/11   Left Lumpectomy/lymph node,sentinel bx,l axillary=+ metastatic ductal ca   COLONOSCOPY     MYOMECTOMY  1997   RE-EXCISION OF BREAST CANCER,SUPERIOR MARGINS  01/12/2012   Procedure: RE-EXCISION OF BREAST CANCER,SUPERIOR MARGINS;  Surgeon: Harl Bowie, MD;  Location: [facility];  Service: General;  Laterality: Left;  re-excision left breast cancer     Current Outpatient Medications  Medication Sig Dispense Refill   Calcium-Vitamin D (CALTRATE 600 PLUS-VIT D PO) Take by mouth 2 (two) times daily.     cholecalciferol (VITAMIN D) 1000 UNITS tablet Take 1,000 Units by mouth daily.     loratadine (CLARITIN) 10 MG tablet Take 10 mg by mouth  daily.     Multiple Vitamin (MULTIVITAMIN) tablet Take 1 tablet by mouth daily.       No current facility-administered medications for this visit.    Family History  Problem Relation Age of Onset   Hypertension Mother    Hypertension Father    Prostate cancer Father        36 yrs ago   Breast cancer Cousin 63   Cancer Paternal Aunt        Colon cancer    Review of Systems  All other systems reviewed and are negative.  Exam:   BP 120/78 (BP Location: Right Arm, Patient Position: Sitting, Cuff Size: Normal)   Pulse 78   Ht 5' 6.5" (1.689 m)   Wt 181 lb (82.1 kg)   SpO2 99%   BMI 28.78 kg/m     General appearance: alert, cooperative and appears stated age Head: normocephalic, without obvious abnormality, atraumatic Neck: no adenopathy, supple, symmetrical, trachea midline and thyroid normal to inspection and palpation Lungs: clear to auscultation bilaterally Breasts: normal appearance, no masses or tenderness, No nipple retraction or dimpling, No nipple discharge or bleeding, No axillary adenopathy Heart: systolic murmur noted.   Occasional addition beat, regularly, irregular. Abdomen: soft, non-tender; no masses, no organomegaly Extremities: extremities normal, atraumatic, no cyanosis or edema Skin: skin color, texture, turgor normal. No rashes  or lesions Lymph nodes: cervical, supraclavicular, and axillary nodes normal. Neurologic: grossly normal  Pelvic: External genitalia:  no lesions              No abnormal inguinal nodes palpated.              Urethra:  normal appearing urethra with no masses, tenderness or lesions              Bartholins and Skenes: normal                 Vagina: 1 cm ulceration of the anterior left mid vagina.  Pap taken.              Cervix: no lesions.  Pap taken.              Pap taken: yes of the vaginal ulcer and the cervix.  Bimanual Exam:  Uterus:  normal size, contour, position, consistency, mobility, non-tender              Adnexa: no  mass, fullness, tenderness              Rectal exam: yes.  Confirms.              Anus:  normal sphincter tone, no lesions  Chaperone was present for exam.  Assessment:   Well woman visit with normal exam. Hx left breast cancer. Status post lumpectomy, XRT, Anastrozole - off since 2020 or 2021.   Vaginal ulcer.   Plan: Mammogram screening discussed. Self breast awareness reviewed. Pap and HR HPV of the cervix.  Pap of the vaginal ulceration.  Guidelines for Calcium, Vitamin D, regular exercise program including  cardiovascular and weight bearing exercise. Routine labs.  TDap.  Return for colposcopy of the vagina and biopsy.  Patient will establish care with PCP.  Follow up annually and prn.

## 2020-08-07 ENCOUNTER — Other Ambulatory Visit (HOSPITAL_COMMUNITY)
Admission: RE | Admit: 2020-08-07 | Discharge: 2020-08-07 | Disposition: A | Discharge status: Home / Self Care | Payer: 59 | Source: Ambulatory Visit | Attending: Obstetrics and Gynecology | Admitting: Obstetrics and Gynecology

## 2020-08-07 ENCOUNTER — Encounter: Payer: Self-pay | Admitting: Obstetrics and Gynecology

## 2020-08-07 ENCOUNTER — Other Ambulatory Visit: Payer: Self-pay

## 2020-08-07 ENCOUNTER — Ambulatory Visit (INDEPENDENT_AMBULATORY_CARE_PROVIDER_SITE_OTHER): Payer: 59 | Admitting: Obstetrics and Gynecology

## 2020-08-07 VITALS — BP 120/78 | HR 78 | Ht 66.5 in | Wt 181.0 lb

## 2020-08-07 DIAGNOSIS — N765 Ulceration of vagina: Secondary | ICD-10-CM | POA: Diagnosis not present

## 2020-08-07 DIAGNOSIS — Z124 Encounter for screening for malignant neoplasm of cervix: Secondary | ICD-10-CM | POA: Diagnosis not present

## 2020-08-07 DIAGNOSIS — Z01419 Encounter for gynecological examination (general) (routine) without abnormal findings: Secondary | ICD-10-CM | POA: Diagnosis not present

## 2020-08-07 DIAGNOSIS — Z23 Encounter for immunization: Secondary | ICD-10-CM

## 2020-08-07 NOTE — Patient Instructions (Signed)

## 2020-08-11 ENCOUNTER — Other Ambulatory Visit: Payer: Self-pay

## 2020-08-11 ENCOUNTER — Other Ambulatory Visit: Payer: 59

## 2020-08-11 DIAGNOSIS — Z1329 Encounter for screening for other suspected endocrine disorder: Secondary | ICD-10-CM

## 2020-08-11 DIAGNOSIS — Z13220 Encounter for screening for lipoid disorders: Secondary | ICD-10-CM

## 2020-08-11 DIAGNOSIS — Z01419 Encounter for gynecological examination (general) (routine) without abnormal findings: Secondary | ICD-10-CM

## 2020-08-11 LAB — CYTOLOGY - PAP
Adequacy: ABSENT
Comment: NEGATIVE
Diagnosis: NEGATIVE
High risk HPV: NEGATIVE

## 2020-08-12 LAB — CYTOLOGY - PAP: Diagnosis: NEGATIVE

## 2020-08-12 NOTE — Addendum Note (Signed)
Addended by: Joaquin Music on: 08/12/2020 08:30 AM   Modules accepted: Orders

## 2020-08-19 ENCOUNTER — Encounter: Payer: Self-pay | Admitting: Obstetrics and Gynecology

## 2020-08-19 ENCOUNTER — Ambulatory Visit (INDEPENDENT_AMBULATORY_CARE_PROVIDER_SITE_OTHER): Payer: 59 | Admitting: Obstetrics and Gynecology

## 2020-08-19 ENCOUNTER — Other Ambulatory Visit (HOSPITAL_COMMUNITY)
Admission: RE | Admit: 2020-08-19 | Discharge: 2020-08-19 | Disposition: A | Discharge status: Home / Self Care | Payer: 59 | Source: Ambulatory Visit | Attending: Obstetrics and Gynecology | Admitting: Obstetrics and Gynecology

## 2020-08-19 ENCOUNTER — Other Ambulatory Visit: Payer: Self-pay

## 2020-08-19 VITALS — BP 134/82

## 2020-08-19 DIAGNOSIS — N952 Postmenopausal atrophic vaginitis: Secondary | ICD-10-CM | POA: Insufficient documentation

## 2020-08-19 DIAGNOSIS — Z01419 Encounter for gynecological examination (general) (routine) without abnormal findings: Secondary | ICD-10-CM

## 2020-08-19 DIAGNOSIS — N765 Ulceration of vagina: Secondary | ICD-10-CM

## 2020-08-19 DIAGNOSIS — Z1329 Encounter for screening for other suspected endocrine disorder: Secondary | ICD-10-CM

## 2020-08-19 DIAGNOSIS — Z0189 Encounter for other specified special examinations: Secondary | ICD-10-CM

## 2020-08-19 DIAGNOSIS — Z13220 Encounter for screening for lipoid disorders: Secondary | ICD-10-CM

## 2020-08-19 NOTE — Patient Instructions (Signed)
Colposcopy, Care After This sheet gives you information about how to care for yourself after your procedure. Your doctor may also give you more specific instructions. If youhave problems or questions, contact your doctor. What can I expect after the procedure? If you did not have a sample of your tissue taken out (did not have a biopsy), you may only have some spotting of blood for a few days. You can go back toyour normal activities. If you had a sample of your tissue taken out, it is common to have: Soreness and mild pain. These may last for a few days. A light-headed feeling. Mild bleeding or fluid (discharge) coming from your vagina. The fluid will look dark and grainy. You may have this for a few days. The fluid may be caused by a liquid that was used during your procedure. You may need to wear a sanitary pad. Spotting of blood for at least 48 hours after the procedure. Follow these instructions at home: Medicines Take over-the-counter and prescription medicines only as told by your doctor. Ask your doctor what medicines you can start taking again. This is very important if you take blood thinners. Activity Limit your activity for the first day after your procedure as told by your doctor. For at least 3 days, or for as long as told by your doctor, avoid: Douching. Using tampons. Having sex. Return to your normal activities as told by your doctor. Ask your doctor what activities are safe for you. General instructions  Drink enough fluid to keep your pee (urine) pale yellow. Ask your doctor if you may take baths, swim, or use a hot tub. You may take showers. If you use birth control (contraception), keep using it. Keep all follow-up visits as told by your doctor. This is important.  Contact a doctor if: You get a skin rash. Get help right away if: You bleed a lot from your vagina. A lot of bleeding means you use more than one pad an hour for 2 hours in a row. You have clumps of  blood (blood clots) coming from your vagina. You have a fever or chills. You have signs of infection. This may be fluid coming from your vagina that is: Different than normal. Yellow. Bad-smelling. You have very bad pain or cramps in your lower belly that do not get better with medicine. You faint. Summary If you did not have a sample of your tissue taken out, you may only have some spotting of blood for a few days. You can go back to your normal activities. If you had a sample of your tissue taken out, it is common to have mild pain for a few days and spotting for 48 hours. Avoid douching, using tampons, and having sex for at least 3 days after the procedure or for as long as told. Get help right away if you have a lot of bleeding, very bad pain, or signs of infection. This information is not intended to replace advice given to you by your health care provider. Make sure you discuss any questions you have with your healthcare provider. Document Revised: 11/26/2019 Document Reviewed: 01/23/2019 Elsevier Patient Education  2022 Elsevier Inc.  

## 2020-08-19 NOTE — Progress Notes (Signed)
GYNECOLOGY  VISIT   HPI: 65 y.o.   Married  [place] American  female   G1P1001 with No LMP recorded. Patient is postmenopausal.   here for   Colposcopy.   Had an anterior vaginal ulceration noted on routine pelvic exam.   Pap of the anterior vagina normal. Pap of the cervix normal and negative HR HPV testing noted.   No prior vaginal procedures other than childbirth.  GYNECOLOGIC HISTORY: No LMP recorded. Patient is postmenopausal. Contraception:  none Menopausal hormone therapy:  none Last mammogram:  12-16-19 Last pap smear:   08-07-20 normal        OB History     Gravida  1   Para  1   Term  1   Preterm      AB      Living  1      SAB      IAB      Ectopic      Multiple      Live Births                 Patient Active Problem List   Diagnosis Date Noted   Seasonal allergies    Heart murmur    History of breast lump/mass excision    Primary cancer of upper outer quadrant of left female breast ([facility]) 11/10/2011   Fibroid     Past Medical History:  Diagnosis Date   Breast cancer ([place]) 11/07/11   left breast 1 o'clock bx=invasive ductal ca,lymphovascular invasion  ER/PR=positive,her 2 neu=neg   Fibroid    Heart murmur    "nothing to worry about", childhood   History of breast lump/mass excision 01/12/12   re-excision left breast   Personal history of radiation therapy    Radiation 02/13/12-04/02/12   Left breast 60.4 gray   Seasonal allergies     Past Surgical History:  Procedure Laterality Date   BREAST LUMPECTOMY Left 11/25/2011   BREAST SURGERY  11/25/11   Left Lumpectomy/lymph node,sentinel bx,l axillary=+ metastatic ductal ca   COLONOSCOPY     MYOMECTOMY  1997   RE-EXCISION OF BREAST CANCER,SUPERIOR MARGINS  01/12/2012   Procedure: RE-EXCISION OF BREAST CANCER,SUPERIOR MARGINS;  Surgeon: Harl Bowie, MD;  Location: [facility];  Service: General;  Laterality: Left;  re-excision left breast cancer     Current Outpatient Medications   Medication Sig Dispense Refill   Calcium-Vitamin D (CALTRATE 600 PLUS-VIT D PO) Take by mouth 2 (two) times daily.     cholecalciferol (VITAMIN D) 1000 UNITS tablet Take 1,000 Units by mouth daily.     loratadine (CLARITIN) 10 MG tablet Take 10 mg by mouth daily.     Multiple Vitamin (MULTIVITAMIN) tablet Take 1 tablet by mouth daily.       No current facility-administered medications for this visit.     ALLERGIES: Penicillins  Family History  Problem Relation Age of Onset   Hypertension Mother    Hypertension Father    Prostate cancer Father        25 yrs ago   Breast cancer Cousin 50   Cancer Paternal Aunt        Colon cancer    Social History   Socioeconomic History   Marital status: Married    Spouse name: Not on file   Number of children: Not on file   Years of education: Not on file   Highest education level: Not on file  Occupational History   Not on file  Tobacco  Use   Smoking status: Never   Smokeless tobacco: Never  Vaping Use   Vaping Use: Never used  Substance and Sexual Activity   Alcohol use: Yes    Alcohol/week: 0.0 standard drinks    Comment: Occas   Drug use: No   Sexual activity: Not Currently    Birth control/protection: Post-menopausal    Comment: 1st intercourse 65 yo-Fewer than 5 partners  Other Topics Concern   Not on file  Social History Narrative   Not on file   Social Determinants of Health   Financial Resource Strain: Not on file  Food Insecurity: Not on file  Transportation Needs: Not on file  Physical Activity: Not on file  Stress: Not on file  Social Connections: Not on file  Intimate Partner Violence: Not on file    Review of Systems  See HPI.    PHYSICAL EXAMINATION:    BP 134/82 (BP Location: Right Arm, Patient Position: Sitting, Cuff Size: Normal)     General appearance: alert, cooperative and appears stated age  Colposcopy of the cervix and vagina. Consent done.  Graves speculum used.  Anterior left vaginal  erythema patch.  3% acetic acid used.  Colposcopy performed.  True ulceration developed in the left anterior apex after contact with speculum.  No other lesions noted.  Biopsy taken and sent to pathology.  Monsel's placed.  No complications.  Minimal EBL.  Chaperone was present for exam:  Onalee Hua, CMA.  ASSESSMENT  Vaginal ulceration.  I suspect atrophy.  Hx breast cancer.  ER and PR positive.  Encounter for routine lab screening.   PLAN  Fu biopsies.  We discussed treatment with water based lubricants, cooking oils, vit E suppositories.  She is not a candidate for vaginal estrogens.  Routine labs drawn today.

## 2020-08-21 LAB — SURGICAL PATHOLOGY

## 2020-08-22 LAB — LIPID PANEL
Cholesterol: 207 mg/dL — ABNORMAL HIGH (ref <200)
HDL: 69 mg/dL (ref >=50)
LDL Cholesterol (Calc): 113 mg/dL (calc) — ABNORMAL HIGH
Non-HDL Cholesterol (Calc): 138 mg/dL (calc) — ABNORMAL HIGH (ref <130)
Total CHOL/HDL Ratio: 3 (calc) (ref <5.0)
Triglycerides: 135 mg/dL (ref <150)

## 2020-08-22 LAB — COMPREHENSIVE METABOLIC PANEL
AG Ratio: 1.8 (calc) (ref 1.0–2.5)
ALT: 17 U/L (ref 6–29)
AST: 27 U/L (ref 10–35)
Albumin: 4.9 g/dL (ref 3.6–5.1)
Alkaline phosphatase (APISO): 86 U/L (ref 37–153)
BUN: 10 mg/dL (ref 7–25)
CO2: 26 mmol/L (ref 20–32)
Calcium: 9.5 mg/dL (ref 8.6–10.4)
Chloride: 103 mmol/L (ref 98–110)
Creat: 0.81 mg/dL (ref 0.50–1.05)
Globulin: 2.7 g/dL (calc) (ref 1.9–3.7)
Glucose, Bld: 88 mg/dL (ref 65–99)
Potassium: 4.2 mmol/L (ref 3.5–5.3)
Sodium: 141 mmol/L (ref 135–146)
Total Bilirubin: 0.6 mg/dL (ref 0.2–1.2)
Total Protein: 7.6 g/dL (ref 6.1–8.1)

## 2020-08-22 LAB — CBC
HCT: 42.8 % (ref 35.0–45.0)
Hemoglobin: 14 g/dL (ref 11.7–15.5)
MCH: 27.6 pg (ref 27.0–33.0)
MCHC: 32.7 g/dL (ref 32.0–36.0)
MCV: 84.4 fL (ref 80.0–100.0)
MPV: 10.8 fL (ref 7.5–12.5)
Platelets: 224 10*3/uL (ref 140–400)
RBC: 5.07 10*6/uL (ref 3.80–5.10)
RDW: 14 % (ref 11.0–15.0)
WBC: 5.5 10*3/uL (ref 3.8–10.8)

## 2020-08-22 LAB — VITAMIN D 1,25 DIHYDROXY
Vitamin D 1, 25 (OH)2 Total: 60 pg/mL (ref 18–72)
Vitamin D2 1, 25 (OH)2: <8 pg/mL
Vitamin D3 1, 25 (OH)2: 60 pg/mL

## 2020-08-22 LAB — TSH: TSH: 2.72 mIU/L (ref 0.40–4.50)

## 2020-11-16 ENCOUNTER — Other Ambulatory Visit: Payer: Self-pay | Admitting: Hematology and Oncology

## 2020-11-16 DIAGNOSIS — Z1231 Encounter for screening mammogram for malignant neoplasm of breast: Secondary | ICD-10-CM

## 2020-12-16 ENCOUNTER — Other Ambulatory Visit: Payer: Self-pay

## 2020-12-16 ENCOUNTER — Ambulatory Visit
Admission: RE | Admit: 2020-12-16 | Discharge: 2020-12-16 | Disposition: A | Discharge status: Home / Self Care | Payer: 59 | Source: Ambulatory Visit | Attending: Hematology and Oncology | Admitting: Hematology and Oncology

## 2020-12-16 DIAGNOSIS — Z1231 Encounter for screening mammogram for malignant neoplasm of breast: Secondary | ICD-10-CM

## 2021-08-24 ENCOUNTER — Ambulatory Visit (INDEPENDENT_AMBULATORY_CARE_PROVIDER_SITE_OTHER): Payer: 59 | Admitting: Obstetrics and Gynecology

## 2021-08-24 ENCOUNTER — Encounter: Payer: Self-pay | Admitting: Obstetrics and Gynecology

## 2021-08-24 VITALS — BP 160/80 | HR 76 | Resp 16 | Ht 66.5 in | Wt 187.0 lb

## 2021-08-24 DIAGNOSIS — Z01419 Encounter for gynecological examination (general) (routine) without abnormal findings: Secondary | ICD-10-CM | POA: Diagnosis not present

## 2021-08-24 NOTE — Patient Instructions (Signed)

## 2021-08-24 NOTE — Progress Notes (Unsigned)
66 y.o. G78P1001 Married [place] American female here for annual exam.    No vaginal bleeding, discharge, or pain.   Using Mucinix.   PCP:  Suzanna Obey, MD     No LMP recorded. Patient is postmenopausal.           Sexually active: No.  The current method of family planning is post menopausal status.    Exercising: Yes.     Walking daily Smoker:  no  Health Maintenance: Pap:  08-07-20 Neg:Neg HR HPV, 07-28-17 Neg:Neg HR HP, 05-27-13 Neg:Neg HR HPV History of abnormal Pap:  no MMG:  12-16-20 Neg/BiRads1 Colonoscopy:  12-16-19 polyps removed.  Due in 5 years.  BMD:   05-30-17  Result :Normal TDaP:  08-07-20 Gardasil:   n/a HIV: Neg in past Hep C: Neg in past Screening Labs:  PCP   reports that she has never smoked. She has never used smokeless tobacco. She reports current alcohol use. She reports that she does not use drugs.  Past Medical History:  Diagnosis Date   Breast cancer ([facility]) 11/07/11   left breast 1 o'clock bx=invasive ductal ca,lymphovascular invasion  ER/PR=positive,her 2 neu=neg   Fibroid    Heart murmur    "nothing to worry about", childhood   History of breast lump/mass excision 01/12/12   re-excision left breast   Personal history of radiation therapy    Radiation 02/13/12-04/02/12   Left breast 60.4 gray   Seasonal allergies     Past Surgical History:  Procedure Laterality Date   BREAST LUMPECTOMY Left 11/25/2011   BREAST SURGERY  11/25/11   Left Lumpectomy/lymph node,sentinel bx,l axillary=+ metastatic ductal ca   COLONOSCOPY     MYOMECTOMY  1997   RE-EXCISION OF BREAST CANCER,SUPERIOR MARGINS  01/12/2012   Procedure: RE-EXCISION OF BREAST CANCER,SUPERIOR MARGINS;  Surgeon: Harl Bowie, MD;  Location: [facility];  Service: General;  Laterality: Left;  re-excision left breast cancer     Current Outpatient Medications  Medication Sig Dispense Refill   Calcium-Vitamin D (CALTRATE 600 PLUS-VIT D PO) Take by mouth 2 (two) times daily.     cholecalciferol  (VITAMIN D) 1000 UNITS tablet Take 1,000 Units by mouth daily.     loratadine (CLARITIN) 10 MG tablet Take 10 mg by mouth daily.     Multiple Vitamin (MULTIVITAMIN) tablet Take 1 tablet by mouth daily.       No current facility-administered medications for this visit.    Family History  Problem Relation Age of Onset   Hypertension Mother    Hypertension Father    Prostate cancer Father        32 yrs ago   Breast cancer Cousin 52   Cancer Paternal Aunt        Colon cancer    Review of Systems  All other systems reviewed and are negative.   Exam:   BP (!) 160/80   Pulse 76   Resp 16   Ht 5' 6.5" (1.689 m)   Wt 187 lb (84.8 kg)   BMI 29.73 kg/m     General appearance: alert, cooperative and appears stated age Head: normocephalic, without obvious abnormality, atraumatic Neck: no adenopathy, supple, symmetrical, trachea midline and thyroid normal to inspection and palpation Lungs: clear to auscultation bilaterally Breasts: normal appearance, no masses or tenderness, No nipple retraction or dimpling, No nipple discharge or bleeding, No axillary adenopathy Heart: regular rate and rhythm.  Blowing systolic murmur.  Abdomen: soft, non-tender; no masses, no organomegaly Extremities: extremities normal, atraumatic,  no cyanosis or edema Skin: skin color, texture, turgor normal. No rashes or lesions Lymph nodes: cervical, supraclavicular, and axillary nodes normal. Neurologic: grossly normal  Pelvic: External genitalia:  no lesions              No abnormal inguinal nodes palpated.              Urethra:  normal appearing urethra with no masses, tenderness or lesions              Bartholins and Skenes: normal                 Vagina: normal appearing vagina with normal color and discharge, atrophy noted of the introitus and vagina. 1.3 cm left anterior mucosa with slight yellowing of the mucosa.              Cervix: no lesions              Pap taken: no. Bimanual Exam:  Uterus:   normal size, contour, position, consistency, mobility, non-tender              Adnexa: no mass, fullness, tenderness              Rectal exam: yes.  Confirms.              Anus:  normal sphincter tone, no lesions  Chaperone was present for exam:  Estill Bamberg, CMA  Assessment:   Well woman visit with gynecologic exam. Hx left breast cancer. Status post lumpectomy, XRT, Anastrozole - off since 2020 or 2021.   Hx vaginal ulcer.  Biopsy in 2022 showing granulation tissue with lymphoplasmacytic infiltrate.  No dysplasia or cancer noted.  Area appears stable and less prominent. Known heart murmur.   Plan: Mammogram screening discussed. Self breast awareness reviewed. Pap and HR HPV as above. Guidelines for Calcium, Vitamin D, regular exercise program including cardiovascular and weight bearing exercise.   Follow up annually and prn.   After visit summary provided.

## 2021-11-26 ENCOUNTER — Other Ambulatory Visit: Payer: Self-pay | Admitting: Family Medicine

## 2021-11-26 DIAGNOSIS — Z1231 Encounter for screening mammogram for malignant neoplasm of breast: Secondary | ICD-10-CM

## 2022-01-11 ENCOUNTER — Ambulatory Visit
Admission: RE | Admit: 2022-01-11 | Discharge: 2022-01-11 | Disposition: A | Discharge status: Home / Self Care | Payer: 59 | Source: Ambulatory Visit | Attending: Family Medicine | Admitting: Family Medicine

## 2022-01-11 DIAGNOSIS — Z1231 Encounter for screening mammogram for malignant neoplasm of breast: Secondary | ICD-10-CM

## 2022-01-18 ENCOUNTER — Ambulatory Visit: Payer: 59

## 2022-10-12 NOTE — Progress Notes (Addendum)
67 y.o. G17P1001 Married [place] American female here for annual exam.    No breast concerns. No GYN concerns.  No vaginal bleeding.   She is asking about bone density.   Lost her job.  Having time to do other things now.   PCP:   Delbert Harness, MD  No LMP recorded. Patient is postmenopausal.           Sexually active: Yes.    The current method of family planning is post menopausal status.    Exercising: Yes.     walking Smoker:  no  Health Maintenance: Pap:  08/07/20 neg: HR HPV neg, 07/28/17 neg: HR HPV neg History of abnormal Pap:  no MMG:  01/11/22 Breast Density Cat B, BI-RADS CAT 1 neg Colonoscopy:  10/05/17 - due in 2024.  BMD:   05/30/17  Result  WNL TDaP:  08/07/20 Gardasil:   no HIV: neg in past Hep C: neg in past Screening Labs:  PCP   reports that she has never smoked. She has never used smokeless tobacco. She reports current alcohol use. She reports that she does not use drugs.  Past Medical History:  Diagnosis Date   Breast cancer ([facility]) 11/07/11   left breast 1 o'clock bx=invasive ductal ca,lymphovascular invasion  ER/PR=positive,her 2 neu=neg   Fibroid    Heart murmur    "nothing to worry about", childhood   History of breast lump/mass excision 01/12/12   re-excision left breast   Personal history of radiation therapy    Radiation 02/13/12-04/02/12   Left breast 60.4 gray   Seasonal allergies     Past Surgical History:  Procedure Laterality Date   BREAST LUMPECTOMY Left 11/25/2011   BREAST SURGERY  11/25/11   Left Lumpectomy/lymph node,sentinel bx,l axillary=+ metastatic ductal ca   COLONOSCOPY     MYOMECTOMY  1997   RE-EXCISION OF BREAST CANCER,SUPERIOR MARGINS  01/12/2012   Procedure: RE-EXCISION OF BREAST CANCER,SUPERIOR MARGINS;  Surgeon: Shelly Rubenstein, MD;  Location: [facility];  Service: General;  Laterality: Left;  re-excision left breast cancer     Current Outpatient Medications  Medication Sig Dispense Refill   Calcium-Vitamin D (CALTRATE 600  PLUS-VIT D PO) Take by mouth 2 (two) times daily.     cholecalciferol (VITAMIN D) 1000 UNITS tablet Take 1,000 Units by mouth daily.     loratadine (CLARITIN) 10 MG tablet Take 10 mg by mouth daily.     Multiple Vitamin (MULTIVITAMIN) tablet Take 1 tablet by mouth daily.       amLODipine (NORVASC) 5 MG tablet Take 5 mg by mouth daily. (Patient not taking: Reported on 10/24/2022)     No current facility-administered medications for this visit.    Family History  Problem Relation Age of Onset   Hypertension Mother    Hypertension Father    Prostate cancer Father        15 yrs ago   Breast cancer Cousin 72   Cancer Paternal Aunt        Colon cancer    Review of Systems  All other systems reviewed and are negative.   Exam:   BP 136/84 (BP Location: Right Arm, Patient Position: Sitting, Cuff Size: Normal)   Pulse 84   Ht 5\' 7"  (1.702 m)   Wt 181 lb (82.1 kg)   SpO2 99%   BMI 28.35 kg/m     General appearance: alert, cooperative and appears stated age Head: normocephalic, without obvious abnormality, atraumatic Neck: no adenopathy, supple, symmetrical, trachea midline  and thyroid normal to inspection and palpation Lungs: clear to auscultation bilaterally Breasts: normal appearance, no masses or tenderness, No nipple retraction or dimpling, No nipple discharge or bleeding, No axillary adenopathy Heart: regular rate and rhythm.  Systolic murmur noted.  Abdomen: soft, non-tender; no masses, no organomegaly Extremities: extremities normal, atraumatic, no cyanosis or edema Skin: skin color, texture, turgor normal. No rashes or lesions Lymph nodes: cervical, supraclavicular, and axillary nodes normal. Neurologic: grossly normal  Pelvic: External genitalia:  no lesions              No abnormal inguinal nodes palpated.              Urethra:  normal appearing urethra with no masses, tenderness or lesions              Bartholins and Skenes: normal                 Vagina: normal  appearing vagina with normal color and discharge, left vaginal apex with what appears to be granulation tissue, friable and bleeds.               Cervix: no lesions              Pap taken: yes Bimanual Exam:  Uterus:  normal size, contour, position, consistency, mobility, non-tender              Adnexa: no mass, fullness, tenderness              Rectal exam: yes.  Confirms.              Anus:  normal sphincter tone, no lesions  Chaperone was present for exam:  Warren Lacy, CMA  Assessment:   Well woman visit with gynecologic exam. Hx left breast cancer. Status post lumpectomy, XRT, Anastrozole - off since 2020 or 2021.   Vaginal lesion. I suspect this is the same area seen in the past.  Hx vaginal ulcer.  Biopsy left anterior vaginal apex in 2022 showing granulation tissue with lymphoplasmacytic infiltrate.  No dysplasia or cancer noted.  Area appears stable and less prominent. Known heart murmur.   Plan: Mammogram screening discussed. Self breast awareness reviewed. Pap and HR HPV collected.  Guidelines for Calcium, Vitamin D, regular exercise program including cardiovascular and weight bearing exercise. She will schedule her colonoscopy.   BMD in 5 years.  Follow up annually and prn.   After visit summary provided.

## 2022-10-24 ENCOUNTER — Encounter: Payer: Self-pay | Admitting: Obstetrics and Gynecology

## 2022-10-24 ENCOUNTER — Ambulatory Visit (INDEPENDENT_AMBULATORY_CARE_PROVIDER_SITE_OTHER): Payer: Medicare Other | Admitting: Obstetrics and Gynecology

## 2022-10-24 ENCOUNTER — Other Ambulatory Visit (HOSPITAL_COMMUNITY)
Admission: RE | Admit: 2022-10-24 | Discharge: 2022-10-24 | Disposition: A | Discharge status: Home / Self Care | Payer: Medicare Other | Source: Ambulatory Visit | Attending: Obstetrics and Gynecology | Admitting: Obstetrics and Gynecology

## 2022-10-24 VITALS — BP 136/84 | HR 84 | Ht 67.0 in | Wt 181.0 lb

## 2022-10-24 DIAGNOSIS — Z1151 Encounter for screening for human papillomavirus (HPV): Secondary | ICD-10-CM | POA: Diagnosis not present

## 2022-10-24 DIAGNOSIS — Z124 Encounter for screening for malignant neoplasm of cervix: Secondary | ICD-10-CM | POA: Diagnosis present

## 2022-10-24 DIAGNOSIS — Z01419 Encounter for gynecological examination (general) (routine) without abnormal findings: Secondary | ICD-10-CM

## 2022-10-24 DIAGNOSIS — N898 Other specified noninflammatory disorders of vagina: Secondary | ICD-10-CM | POA: Diagnosis not present

## 2022-10-24 DIAGNOSIS — Z853 Personal history of malignant neoplasm of breast: Secondary | ICD-10-CM

## 2022-10-24 DIAGNOSIS — Z9189 Other specified personal risk factors, not elsewhere classified: Secondary | ICD-10-CM | POA: Diagnosis not present

## 2022-10-24 NOTE — Patient Instructions (Signed)

## 2022-10-28 LAB — CYTOLOGY - PAP
Comment: NEGATIVE
Diagnosis: NEGATIVE
High risk HPV: NEGATIVE

## 2022-11-08 ENCOUNTER — Other Ambulatory Visit: Payer: Self-pay | Admitting: Family Medicine

## 2022-11-08 DIAGNOSIS — Z0000 Encounter for general adult medical examination without abnormal findings: Secondary | ICD-10-CM

## 2023-01-16 ENCOUNTER — Ambulatory Visit
Admission: RE | Admit: 2023-01-16 | Discharge: 2023-01-16 | Disposition: A | Discharge status: Home / Self Care | Payer: Medicare Other | Source: Ambulatory Visit | Attending: Family Medicine | Admitting: Family Medicine

## 2023-01-16 DIAGNOSIS — Z0000 Encounter for general adult medical examination without abnormal findings: Secondary | ICD-10-CM

## 2023-01-20 ENCOUNTER — Encounter: Payer: Self-pay | Admitting: Obstetrics and Gynecology

## 2023-12-12 ENCOUNTER — Other Ambulatory Visit: Payer: Self-pay | Admitting: Family Medicine

## 2023-12-12 DIAGNOSIS — Z1231 Encounter for screening mammogram for malignant neoplasm of breast: Secondary | ICD-10-CM

## 2024-01-17 ENCOUNTER — Ambulatory Visit
Admission: RE | Admit: 2024-01-17 | Discharge: 2024-01-17 | Disposition: A | Discharge status: Home / Self Care | Source: Ambulatory Visit | Attending: Family Medicine | Admitting: Family Medicine

## 2024-01-17 DIAGNOSIS — Z1231 Encounter for screening mammogram for malignant neoplasm of breast: Secondary | ICD-10-CM
